# Patient Record
Sex: Female | Born: 1935 | Race: Black or African American | Hispanic: No | Smoking: Never smoker
Health system: Southern US, Community
[De-identification: ages and names within clinical notes are randomized; demographics above are authoritative.]

## PROBLEM LIST (undated history)

## (undated) DIAGNOSIS — I10 Essential (primary) hypertension: Secondary | ICD-10-CM

## (undated) DIAGNOSIS — K56609 Unspecified intestinal obstruction, unspecified as to partial versus complete obstruction: Secondary | ICD-10-CM

## (undated) DIAGNOSIS — I2699 Other pulmonary embolism without acute cor pulmonale: Secondary | ICD-10-CM

## (undated) DIAGNOSIS — K469 Unspecified abdominal hernia without obstruction or gangrene: Secondary | ICD-10-CM

## (undated) DIAGNOSIS — H409 Unspecified glaucoma: Secondary | ICD-10-CM

## (undated) DIAGNOSIS — E119 Type 2 diabetes mellitus without complications: Secondary | ICD-10-CM

## (undated) DIAGNOSIS — K5792 Diverticulitis of intestine, part unspecified, without perforation or abscess without bleeding: Secondary | ICD-10-CM

## (undated) HISTORY — PX: HERNIA REPAIR: SHX51

---

## 2013-02-14 ENCOUNTER — Other Ambulatory Visit: Payer: Self-pay

## 2013-02-14 ENCOUNTER — Emergency Department (HOSPITAL_BASED_OUTPATIENT_CLINIC_OR_DEPARTMENT_OTHER)
Admission: EM | Admit: 2013-02-14 | Discharge: 2013-02-15 | Disposition: A | Discharge status: Home / Self Care | Payer: Medicare Other | Attending: Emergency Medicine | Admitting: Emergency Medicine

## 2013-02-14 ENCOUNTER — Emergency Department (HOSPITAL_BASED_OUTPATIENT_CLINIC_OR_DEPARTMENT_OTHER): Payer: Medicare Other

## 2013-02-14 ENCOUNTER — Encounter (HOSPITAL_BASED_OUTPATIENT_CLINIC_OR_DEPARTMENT_OTHER): Payer: Self-pay

## 2013-02-14 DIAGNOSIS — I10 Essential (primary) hypertension: Secondary | ICD-10-CM | POA: Insufficient documentation

## 2013-02-14 DIAGNOSIS — Z7901 Long term (current) use of anticoagulants: Secondary | ICD-10-CM | POA: Insufficient documentation

## 2013-02-14 DIAGNOSIS — E119 Type 2 diabetes mellitus without complications: Secondary | ICD-10-CM | POA: Insufficient documentation

## 2013-02-14 DIAGNOSIS — Z8719 Personal history of other diseases of the digestive system: Secondary | ICD-10-CM | POA: Insufficient documentation

## 2013-02-14 DIAGNOSIS — Z9889 Other specified postprocedural states: Secondary | ICD-10-CM | POA: Insufficient documentation

## 2013-02-14 DIAGNOSIS — R51 Headache: Secondary | ICD-10-CM | POA: Insufficient documentation

## 2013-02-14 DIAGNOSIS — H409 Unspecified glaucoma: Secondary | ICD-10-CM | POA: Insufficient documentation

## 2013-02-14 DIAGNOSIS — Z79899 Other long term (current) drug therapy: Secondary | ICD-10-CM | POA: Insufficient documentation

## 2013-02-14 DIAGNOSIS — R109 Unspecified abdominal pain: Secondary | ICD-10-CM

## 2013-02-14 DIAGNOSIS — R112 Nausea with vomiting, unspecified: Secondary | ICD-10-CM

## 2013-02-14 DIAGNOSIS — Z86711 Personal history of pulmonary embolism: Secondary | ICD-10-CM | POA: Insufficient documentation

## 2013-02-14 HISTORY — DX: Essential (primary) hypertension: I10

## 2013-02-14 HISTORY — DX: Unspecified glaucoma: H40.9

## 2013-02-14 HISTORY — DX: Unspecified abdominal hernia without obstruction or gangrene: K46.9

## 2013-02-14 HISTORY — DX: Diverticulitis of intestine, part unspecified, without perforation or abscess without bleeding: K57.92

## 2013-02-14 HISTORY — DX: Type 2 diabetes mellitus without complications: E11.9

## 2013-02-14 HISTORY — DX: Other pulmonary embolism without acute cor pulmonale: I26.99

## 2013-02-14 LAB — COMPREHENSIVE METABOLIC PANEL
ALT: 14 U/L (ref 0–35)
AST: 22 U/L (ref 0–37)
Albumin: 3.7 g/dL (ref 3.5–5.2)
Alkaline Phosphatase: 61 U/L (ref 39–117)
BUN: 22 mg/dL (ref 6–23)
CO2: 29 mEq/L (ref 19–32)
Calcium: 11.1 mg/dL — ABNORMAL HIGH (ref 8.4–10.5)
Chloride: 100 mEq/L (ref 96–112)
Creatinine, Ser: 0.9 mg/dL (ref 0.50–1.10)
GFR calc Af Amer: 68 mL/min — ABNORMAL LOW (ref >90)
GFR calc non Af Amer: 59 mL/min — ABNORMAL LOW (ref >90)
Glucose, Bld: 159 mg/dL — ABNORMAL HIGH (ref 70–99)
Potassium: 4.5 mEq/L (ref 3.5–5.1)
Sodium: 139 mEq/L (ref 135–145)
Total Bilirubin: 0.3 mg/dL (ref 0.3–1.2)
Total Protein: 8.4 g/dL — ABNORMAL HIGH (ref 6.0–8.3)

## 2013-02-14 LAB — CBC WITH DIFFERENTIAL/PLATELET
Basophils Absolute: 0 10*3/uL (ref 0.0–0.1)
Basophils Relative: 0 % (ref 0–1)
Eosinophils Absolute: 0.1 10*3/uL (ref 0.0–0.7)
Eosinophils Relative: 1 % (ref 0–5)
HCT: 34.3 % — ABNORMAL LOW (ref 36.0–46.0)
Hemoglobin: 11.3 g/dL — ABNORMAL LOW (ref 12.0–15.0)
Lymphocytes Relative: 9 % — ABNORMAL LOW (ref 12–46)
Lymphs Abs: 0.9 10*3/uL (ref 0.7–4.0)
MCH: 29 pg (ref 26.0–34.0)
MCHC: 32.9 g/dL (ref 30.0–36.0)
MCV: 87.9 fL (ref 78.0–100.0)
Monocytes Absolute: 0.7 10*3/uL (ref 0.1–1.0)
Monocytes Relative: 7 % (ref 3–12)
Neutro Abs: 8.9 10*3/uL — ABNORMAL HIGH (ref 1.7–7.7)
Neutrophils Relative %: 84 % — ABNORMAL HIGH (ref 43–77)
Platelets: 188 10*3/uL (ref 150–400)
RBC: 3.9 MIL/uL (ref 3.87–5.11)
RDW: 15.7 % — ABNORMAL HIGH (ref 11.5–15.5)
WBC: 10.6 10*3/uL — ABNORMAL HIGH (ref 4.0–10.5)

## 2013-02-14 LAB — PROTIME-INR
INR: 1.58 — ABNORMAL HIGH (ref 0.00–1.49)
Prothrombin Time: 18.4 seconds — ABNORMAL HIGH (ref 11.6–15.2)

## 2013-02-14 LAB — URINALYSIS, ROUTINE W REFLEX MICROSCOPIC
Bilirubin Urine: NEGATIVE
Glucose, UA: NEGATIVE mg/dL
Hgb urine dipstick: NEGATIVE
Ketones, ur: NEGATIVE mg/dL
Leukocytes, UA: NEGATIVE
Nitrite: NEGATIVE
Protein, ur: NEGATIVE mg/dL
Specific Gravity, Urine: 1.008 (ref 1.005–1.030)
Urobilinogen, UA: 0.2 mg/dL (ref 0.0–1.0)
pH: 7.5 (ref 5.0–8.0)

## 2013-02-14 LAB — LIPASE, BLOOD: Lipase: 25 U/L (ref 11–59)

## 2013-02-14 MED ORDER — IOHEXOL 300 MG/ML  SOLN
50.0000 mL | Freq: Once | INTRAMUSCULAR | Status: AC | PRN
  Administered 2013-02-14: 50 mL via ORAL

## 2013-02-14 MED ORDER — ONDANSETRON HCL 4 MG/2ML IJ SOLN: 4.0000 mg | Freq: Once | INTRAMUSCULAR | Status: DC

## 2013-02-14 MED ORDER — ONDANSETRON 8 MG PO TBDP: 8.0000 mg | ORAL_TABLET | Freq: Three times a day (TID) | ORAL | Status: DC | PRN

## 2013-02-14 MED ORDER — SODIUM CHLORIDE 0.9 % IV BOLUS (SEPSIS)
500.0000 mL | Freq: Once | INTRAVENOUS | Status: AC
  Administered 2013-02-14: 500 mL via INTRAVENOUS

## 2013-02-14 MED ORDER — SODIUM CHLORIDE 0.9 % IV BOLUS (SEPSIS)
1000.0000 mL | Freq: Once | INTRAVENOUS | Status: DC
  Administered 2013-02-14: 1000 mL via INTRAVENOUS

## 2013-02-14 MED ORDER — IOHEXOL 300 MG/ML  SOLN
100.0000 mL | Freq: Once | INTRAMUSCULAR | Status: AC | PRN
  Administered 2013-02-14: 100 mL via INTRAVENOUS

## 2013-02-14 MED ORDER — ONDANSETRON HCL 4 MG/2ML IJ SOLN
4.0000 mg | Freq: Once | INTRAMUSCULAR | Status: AC
  Administered 2013-02-14: 4 mg via INTRAVENOUS

## 2013-02-14 NOTE — ED Notes (Signed)
MD with pt  

## 2013-02-14 NOTE — ED Notes (Signed)
Returned from CT.

## 2013-02-14 NOTE — ED Notes (Signed)
Pt in CT.

## 2013-02-14 NOTE — ED Notes (Signed)
Pt states that she had onset of abdominal cramping, nausea, vomiting.  No diarrhea.  Pt Last BM 02/13/2013.  Hx of diverticulitis; long time since last flair per pt

## 2013-02-14 NOTE — ED Notes (Signed)
The patient is undressed and in a gown. The bed rails are up and the bed is locked and in the lowest position. The call light is within reach, warm blankets have been given.

## 2013-02-14 NOTE — ED Notes (Signed)
Transported to CT 

## 2013-02-14 NOTE — ED Notes (Signed)
o2 placed on pt at 2L per [place].for decreased pO2

## 2013-02-14 NOTE — ED Provider Notes (Signed)
History  This chart was scribed for Hilario Quarry, MD by Shari Heritage, ED Scribe. The patient was seen in room MH05/MH05. Patient's care was started at 1726.   CSN: [id number]  Arrival date & time 02/14/13  1713   First MD Initiated Contact with Patient 02/14/13 1726      Chief Complaint  Patient presents with  . Abdominal Pain     Patient is a 79 y.o. female presenting with abdominal pain. The history is provided by the patient. No language interpreter was used.  Abdominal Pain Pain radiates to:  Does not radiate Pain severity:  Severe Onset quality:  Sudden Duration:  7 hours Timing:  Constant Progression:  Unchanged Chronicity:  New Associated symptoms: nausea and vomiting      HPI Comments: Kathryn Bradford is a 79 y.o. female who presents to the Emergency Department complaining of sudden, moderate to severe, constant, abdominal pain onset 7 hours ago. Daughter states that while at church, she had a sudden onset temporal headache followed by severe abdominal pain. Patient says that her headache is resolved, but abdominal pain is still present. There is associated nausea and vomiting. She states that she had a regular breakfast this morning. Patient has a history of diverticulitis, but hasn't had a flare up in several months. Other medical history includes PE, diabetes and hypertension. Patient is currently on Coumadin. Patient's daughter states that she was last hospitalized in 2012. Patient has a history of PE and was giving herself Lovenox injections, but she was developed a series of bruises to her abdomen due to improper administration of the medication. She has a surgical history of hernia repair (2001). Patient uses chewing tobacco, but does not smoke.  PCP - Dr. Wynelle Link, [facility]   Past Medical History  Diagnosis Date  . Diverticulitis   . Hernia   . Diabetes mellitus without complication   . Pulmonary emboli   . Hypertension   . Glaucoma      Past Surgical History  Procedure Laterality Date  . Hernia repair      History reviewed. No pertinent family history.  History  Substance Use Topics  . Smoking status: Never Smoker   . Smokeless tobacco: Current User    Types: Snuff  . Alcohol Use: No    OB History   Grav Para Term Preterm Abortions TAB SAB Ect Mult Living                  Review of Systems  Gastrointestinal: Positive for nausea, vomiting and abdominal pain.  All other systems reviewed and are negative.    Allergies  Review of patient's allergies indicates no known allergies.  Home Medications   Current Outpatient Rx  Name  Route  Sig  Dispense  Refill  . brimonidine (ALPHAGAN P) 0.1 % SOLN   Both Eyes   Place 1 drop into both eyes daily.         Marland Kitchen diltiazem (TIAZAC) 360 MG 24 hr capsule   Oral   Take 360 mg by mouth daily.         . dorzolamide-timolol (COSOPT) 22.3-6.8 MG/ML ophthalmic solution   Both Eyes   Place 1 drop into both eyes 2 (two) times daily.         Marland Kitchen lisinopril (PRINIVIL,ZESTRIL) 10 MG tablet   Oral   Take 10 mg by mouth daily.         . pioglitazone (ACTOS) 15 MG tablet   Oral  Take 15 mg by mouth daily.         . simvastatin (ZOCOR) 10 MG tablet   Oral   Take 10 mg by mouth at bedtime.         Marland Kitchen warfarin (COUMADIN) 7.5 MG tablet   Oral   Take 7.5 mg by mouth daily.           Triage Vitals: BP 170/64  Pulse 84  Temp(Src) 97.6 F (36.4 C) (Oral)  Resp 20  SpO2 96%  Physical Exam  Constitutional: She is oriented to person, place, and time. She appears well-developed and well-nourished. No distress.  HENT:  Head: Normocephalic and atraumatic.  Eyes: Conjunctivae and EOM are normal. Pupils are equal, round, and reactive to light.  Neck: Normal range of motion. Neck supple.  Cardiovascular: Normal rate, regular rhythm and normal heart sounds.   Abdominal: Soft. There is tenderness.  Hyperactive bowel sounds. Anterior hernias that are  easily reducible, nontender.  Neurological: She is alert and oriented to person, place, and time.  Skin: Skin is warm and dry.    ED Course  Procedures (including critical care time) DIAGNOSTIC STUDIES: Oxygen Saturation is 96% on room air, adequate by my interpretation.    COORDINATION OF CARE: 5:50 PM- Patient informed of current plan for treatment and evaluation and agrees with plan at this time.    Labs Reviewed  CBC WITH DIFFERENTIAL - Abnormal; Notable for the following:    WBC 10.6 (*)    Hemoglobin 11.3 (*)    HCT 34.3 (*)    RDW 15.7 (*)    Neutrophils Relative 84 (*)    Neutro Abs 8.9 (*)    Lymphocytes Relative 9 (*)    All other components within normal limits  COMPREHENSIVE METABOLIC PANEL - Abnormal; Notable for the following:    Glucose, Bld 159 (*)    Calcium 11.1 (*)    Total Protein 8.4 (*)    GFR calc non Af Amer 59 (*)    GFR calc Af Amer 68 (*)    All other components within normal limits  PROTIME-INR - Abnormal; Notable for the following:    Prothrombin Time 18.4 (*)    INR 1.58 (*)    All other components within normal limits  LIPASE, BLOOD  URINALYSIS, ROUTINE W REFLEX MICROSCOPIC             11.3 (*)    HCT 34.3 (*)    RDW 15.7 (*)    Neutrophils Relative 84 (*)    Neutro Abs 8.9 (*)    Lymphocytes Relative 9 (*)    All other components within normal limits  COMPREHENSIVE METABOLIC PANEL - Abnormal; Notable for the following:    Glucose, Bld 159 (*)    Calcium 11.1 (*)    Total Protein 8.4 (*)    GFR calc non Af Amer 59 (*)    GFR calc Af Amer 68 (*)    All other components within normal limits  PROTIME-INR - Abnormal; Notable for the following:    Prothrombin Time 18.4 (*)    INR 1.58 (*)    All other components within normal limits  LIPASE, BLOOD  URINALYSIS, ROUTINE W REFLEX MICROSCOPIC    No results found.   No diagnosis found.    MDM  Patient feels improved.  Labs reviewed.  Discussed plan with patient and family and  voice agreement.   I personally performed the services described in this documentation, which was scribed in  my presence. The recorded information has been reviewed and considered.   Hilario Quarry, MD 02/16/13 [phone]

## 2013-02-15 MED ORDER — METRONIDAZOLE 500 MG PO TABS: 500.0000 mg | ORAL_TABLET | Freq: Two times a day (BID) | ORAL | Status: DC

## 2013-02-15 MED ORDER — ONDANSETRON 8 MG PO TBDP: 8.0000 mg | ORAL_TABLET | Freq: Three times a day (TID) | ORAL | Status: DC | PRN

## 2013-02-15 MED ORDER — CIPROFLOXACIN HCL 500 MG PO TABS: 500.0000 mg | ORAL_TABLET | Freq: Two times a day (BID) | ORAL | Status: DC

## 2013-02-15 NOTE — ED Provider Notes (Signed)
Ct shows few hernia, reducible on my exam. She also has some enteritis, non specific. Non tender abd exam Pt advised to consume soft diet. We will give her cipro and flagyl. Advised to return to the ER id symptoms get worse.    Derwood Kaplan, MD 02/15/13 [phone]

## 2013-02-15 NOTE — ED Notes (Signed)
MD with pt  

## 2013-08-16 ENCOUNTER — Emergency Department (HOSPITAL_BASED_OUTPATIENT_CLINIC_OR_DEPARTMENT_OTHER): Payer: Medicare Other

## 2013-08-16 ENCOUNTER — Encounter (HOSPITAL_BASED_OUTPATIENT_CLINIC_OR_DEPARTMENT_OTHER): Payer: Self-pay

## 2013-08-16 ENCOUNTER — Emergency Department (HOSPITAL_BASED_OUTPATIENT_CLINIC_OR_DEPARTMENT_OTHER)
Admission: EM | Admit: 2013-08-16 | Discharge: 2013-08-17 | Disposition: A | Discharge status: Other Acute Inpatient Hospital | Payer: Medicare Other | Attending: Emergency Medicine | Admitting: Emergency Medicine

## 2013-08-16 DIAGNOSIS — Z79899 Other long term (current) drug therapy: Secondary | ICD-10-CM | POA: Insufficient documentation

## 2013-08-16 DIAGNOSIS — Z792 Long term (current) use of antibiotics: Secondary | ICD-10-CM | POA: Insufficient documentation

## 2013-08-16 DIAGNOSIS — H409 Unspecified glaucoma: Secondary | ICD-10-CM | POA: Insufficient documentation

## 2013-08-16 DIAGNOSIS — I2699 Other pulmonary embolism without acute cor pulmonale: Secondary | ICD-10-CM | POA: Insufficient documentation

## 2013-08-16 DIAGNOSIS — I10 Essential (primary) hypertension: Secondary | ICD-10-CM | POA: Insufficient documentation

## 2013-08-16 DIAGNOSIS — E119 Type 2 diabetes mellitus without complications: Secondary | ICD-10-CM | POA: Insufficient documentation

## 2013-08-16 DIAGNOSIS — Z7901 Long term (current) use of anticoagulants: Secondary | ICD-10-CM | POA: Insufficient documentation

## 2013-08-16 DIAGNOSIS — K5732 Diverticulitis of large intestine without perforation or abscess without bleeding: Secondary | ICD-10-CM | POA: Insufficient documentation

## 2013-08-16 DIAGNOSIS — K56609 Unspecified intestinal obstruction, unspecified as to partial versus complete obstruction: Secondary | ICD-10-CM | POA: Insufficient documentation

## 2013-08-16 HISTORY — DX: Unspecified intestinal obstruction, unspecified as to partial versus complete obstruction: K56.609

## 2013-08-16 LAB — COMPREHENSIVE METABOLIC PANEL
ALT: 14 U/L (ref 0–35)
AST: 21 U/L (ref 0–37)
Albumin: 4.1 g/dL (ref 3.5–5.2)
Alkaline Phosphatase: 78 U/L (ref 39–117)
BUN: 21 mg/dL (ref 6–23)
CO2: 29 mEq/L (ref 19–32)
Calcium: 11.5 mg/dL — ABNORMAL HIGH (ref 8.4–10.5)
Chloride: 98 mEq/L (ref 96–112)
Creatinine, Ser: 0.8 mg/dL (ref 0.50–1.10)
GFR calc Af Amer: 78 mL/min — ABNORMAL LOW (ref >90)
GFR calc non Af Amer: 67 mL/min — ABNORMAL LOW (ref >90)
Glucose, Bld: 181 mg/dL — ABNORMAL HIGH (ref 70–99)
Potassium: 3.8 mEq/L (ref 3.5–5.1)
Sodium: 138 mEq/L (ref 135–145)
Total Bilirubin: 0.5 mg/dL (ref 0.3–1.2)
Total Protein: 8.9 g/dL — ABNORMAL HIGH (ref 6.0–8.3)

## 2013-08-16 LAB — URINALYSIS, ROUTINE W REFLEX MICROSCOPIC
Bilirubin Urine: NEGATIVE
Glucose, UA: NEGATIVE mg/dL
Hgb urine dipstick: NEGATIVE
Ketones, ur: NEGATIVE mg/dL
Leukocytes, UA: NEGATIVE
Nitrite: NEGATIVE
Protein, ur: 100 mg/dL — AB
Specific Gravity, Urine: 1.013 (ref 1.005–1.030)
Urobilinogen, UA: 0.2 mg/dL (ref 0.0–1.0)
pH: 8.5 — ABNORMAL HIGH (ref 5.0–8.0)

## 2013-08-16 LAB — CBC WITH DIFFERENTIAL/PLATELET
Basophils Absolute: 0 10*3/uL (ref 0.0–0.1)
Basophils Relative: 0 % (ref 0–1)
Eosinophils Absolute: 0.1 10*3/uL (ref 0.0–0.7)
Eosinophils Relative: 1 % (ref 0–5)
HCT: 40.6 % (ref 36.0–46.0)
Hemoglobin: 13.5 g/dL (ref 12.0–15.0)
Lymphocytes Relative: 11 % — ABNORMAL LOW (ref 12–46)
Lymphs Abs: 0.8 10*3/uL (ref 0.7–4.0)
MCH: 30.3 pg (ref 26.0–34.0)
MCHC: 33.3 g/dL (ref 30.0–36.0)
MCV: 91 fL (ref 78.0–100.0)
Monocytes Absolute: 0.5 10*3/uL (ref 0.1–1.0)
Monocytes Relative: 6 % (ref 3–12)
Neutro Abs: 6.3 10*3/uL (ref 1.7–7.7)
Neutrophils Relative %: 82 % — ABNORMAL HIGH (ref 43–77)
Platelets: 170 10*3/uL (ref 150–400)
RBC: 4.46 MIL/uL (ref 3.87–5.11)
RDW: 14 % (ref 11.5–15.5)
WBC: 7.6 10*3/uL (ref 4.0–10.5)

## 2013-08-16 LAB — URINE MICROSCOPIC-ADD ON

## 2013-08-16 LAB — LIPASE, BLOOD: Lipase: 21 U/L (ref 11–59)

## 2013-08-16 MED ORDER — IOHEXOL 300 MG/ML  SOLN
50.0000 mL | Freq: Once | INTRAMUSCULAR | Status: AC | PRN
  Administered 2013-08-16: 50 mL via ORAL

## 2013-08-16 MED ORDER — ONDANSETRON HCL 4 MG/2ML IJ SOLN
4.0000 mg | Freq: Once | INTRAMUSCULAR | Status: AC
  Administered 2013-08-16: 4 mg via INTRAVENOUS
  Filled 2013-08-16: qty 2

## 2013-08-16 MED ORDER — SODIUM CHLORIDE 0.9 % IV SOLN
1000.0000 mL | Freq: Once | INTRAVENOUS | Status: AC
  Administered 2013-08-16: 1000 mL via INTRAVENOUS

## 2013-08-16 MED ORDER — HYDROMORPHONE HCL PF 1 MG/ML IJ SOLN
0.5000 mg | Freq: Once | INTRAMUSCULAR | Status: AC
  Administered 2013-08-16: 0.5 mg via INTRAVENOUS
  Filled 2013-08-16: qty 1

## 2013-08-16 NOTE — ED Notes (Signed)
CT notified pt has completed oral contrast.  

## 2013-08-16 NOTE — ED Notes (Signed)
MD at bedside. 

## 2013-08-16 NOTE — ED Notes (Signed)
abd pain, n/v since 3pm

## 2013-08-16 NOTE — ED Provider Notes (Addendum)
CSN: [id number]     Arrival date & time 08/16/13  1957 History  This chart was scribed for Kathryn Munch, MD by Greggory Stallion, ED Scribe. This patient was seen in room MH03/MH03 and the patient's care was started at 8:46 PM.    Chief Complaint  Patient presents with  . Abdominal Pain   HPI HPI Comments: Kathryn Bradford is a 79 y.o. female with a history of diabetes, glaucoma, HTN, multiple abdominal surgeries, previously diagnosed hernias and at least one bowel obstruction who presents to the Emergency Department complaining of emesis which started today around 4 PM (five hours pta). Pt also c/o of a pain in her lower abdominal region described as "burning" due to the hernias that she has. Pt has not been treated surgically for her hernias due to medical issues and age.  The patient was last admitted for bowel obstruction approximately one year ago.  During that admission she was deemed a non-surgical candidate. During this episode there've been no clear alleviating factors.  She typically can reduce hernias when she feels similar symptoms, but that has not been possible this episode.   Dr. Chales Salmon [facility])   Past Medical History  Diagnosis Date  . Diverticulitis   . Hernia   . Diabetes mellitus without complication   . Pulmonary emboli   . Hypertension   . Glaucoma   . Bowel obstruction    Past Surgical History  Procedure Laterality Date  . Hernia repair     No family history on file. History  Substance Use Topics  . Smoking status: Never Smoker   . Smokeless tobacco: Current User    Types: Snuff  . Alcohol Use: No   OB History   Grav Para Term Preterm Abortions TAB SAB Ect Mult Living                 Review of Systems  Constitutional:       Per HPI, otherwise negative  HENT:       Per HPI, otherwise negative  Respiratory:       Per HPI, otherwise negative  Cardiovascular:       Per HPI, otherwise negative  Gastrointestinal: Positive for nausea and  vomiting. Negative for blood in stool.  Endocrine:       Negative aside from HPI  Genitourinary:       Neg aside from HPI   Musculoskeletal:       Per HPI, otherwise negative  Skin: Negative.   Neurological: Positive for weakness. Negative for syncope.    Allergies  Review of patient's allergies indicates no known allergies.  Home Medications   Current Outpatient Rx  Name  Route  Sig  Dispense  Refill  . brimonidine (ALPHAGAN P) 0.1 % SOLN   Both Eyes   Place 1 drop into both eyes daily.         . ciprofloxacin (CIPRO) 500 MG tablet   Oral   Take 1 tablet (500 mg total) by mouth every 12 (twelve) hours.   10 tablet   0   . diltiazem (TIAZAC) 360 MG 24 hr capsule   Oral   Take 360 mg by mouth daily.         . dorzolamide-timolol (COSOPT) 22.3-6.8 MG/ML ophthalmic solution   Both Eyes   Place 1 drop into both eyes 2 (two) times daily.         Marland Kitchen lisinopril (PRINIVIL,ZESTRIL) 10 MG tablet   Oral   Take 10 mg by  mouth daily.         . metroNIDAZOLE (FLAGYL) 500 MG tablet   Oral   Take 1 tablet (500 mg total) by mouth 2 (two) times daily.   14 tablet   0   . ondansetron (ZOFRAN ODT) 8 MG disintegrating tablet   Oral   Take 1 tablet (8 mg total) by mouth every 8 (eight) hours as needed for nausea.   20 tablet   0   . ondansetron (ZOFRAN ODT) 8 MG disintegrating tablet   Oral   Take 1 tablet (8 mg total) by mouth every 8 (eight) hours as needed for nausea.   20 tablet   0   . pioglitazone (ACTOS) 15 MG tablet   Oral   Take 15 mg by mouth daily.         . simvastatin (ZOCOR) 10 MG tablet   Oral   Take 10 mg by mouth at bedtime.         Marland Kitchen warfarin (COUMADIN) 7.5 MG tablet   Oral   Take 7.5 mg by mouth daily.          BP 167/87  Pulse 73  Temp(Src) 98.3 F (36.8 C) (Oral)  Resp 20  Ht 5\' 4"  (1.626 m)  Wt 174 lb (78.926 kg)  BMI 29.85 kg/m2  SpO2 98% Physical Exam  Constitutional: She is oriented to person, place, and time. She  appears ill.  HENT:  Head: Normocephalic and atraumatic.  Eyes: Conjunctivae are normal. Pupils are equal, round, and reactive to light. Right eye exhibits no discharge. Left eye exhibits no discharge.  Neck: No tracheal deviation present.  Cardiovascular: Normal rate and intact distal pulses.   Murmur heard. Pulmonary/Chest: Effort normal. No stridor. No respiratory distress.  Abdominal: There is tenderness. There is guarding.  There are multiple abnormalities in the lower abdomen including midline surgical scar, palpable masses on either side of midline.  There is no reduce-ability of the hernias.  Bowel sounds are appreciable. The upper abdomen is essentially soft.  Neurological: She is alert and oriented to person, place, and time. No cranial nerve deficit. She exhibits normal muscle tone. Coordination normal.  Skin: Skin is warm and dry.  Psychiatric: She is slowed and withdrawn.    ED Course  Procedures (including critical care time)  DIAGNOSTIC STUDIES: Oxygen Saturation is 98% on RA, Normal by my interpretation.    COORDINATION OF CARE: 8:30 PM-Discussed treatment plan which includes pain medication, nausua medication, blood work, and CT scan. Pt agreed to plan.     Labs Review Labs Reviewed  CBC WITH DIFFERENTIAL - Abnormal; Notable for the following:    Neutrophils Relative % 82 (*)    Lymphocytes Relative 11 (*)    All other components within normal limits  COMPREHENSIVE METABOLIC PANEL - Abnormal; Notable for the following:    Glucose, Bld 181 (*)    Calcium 11.5 (*)    Total Protein 8.9 (*)    GFR calc non Af Amer 67 (*)    GFR calc Af Amer 78 (*)    All other components within normal limits  URINALYSIS, ROUTINE W REFLEX MICROSCOPIC - Abnormal; Notable for the following:    APPearance TURBID (*)    pH 8.5 (*)    Protein, ur 100 (*)    All other components within normal limits  URINE MICROSCOPIC-ADD ON - Abnormal; Notable for the following:    Squamous  Epithelial / LPF FEW (*)    All other components  within normal limits  LIPASE, BLOOD   Imaging Review No results found.   11:56 PM On repeat assessment the patient appears in a similar condition.  I discussed results with her with patient and her family members.  With recurrent bowel obstruction she'll be admitted for further evaluation and management.  The patient requests [facility].   12:03 AM I discussed her case with the surgical team at [facility]), they will follow as a consulting service. MDM  SBO   This patient with hernia, prior bowel obstruction now presents with abdominal pain, nausea, vomiting, bloating sensation.  On exam there is an appreciable hernia that is nonreducible.  Though there are bowel sounds, the immediate concern is for bowel structure, which is demonstrated on CT scan.  Patient has previously been deemed a nonsurgical candidate.  However, with her pain, she will be admitted for further evaluation and management.  Attempts to pass NG tube was performed prior to transfer.   I personally performed the services described in this documentation, which was scribed in my presence. The recorded information has been reviewed and is accurate.      Kathryn Munch, MD 08/17/13 0000  Kathryn Munch, MD 08/17/13 1027  Kathryn Munch, MD 08/17/13 [phone]

## 2013-08-17 LAB — GLUCOSE, CAPILLARY: Glucose-Capillary: 135 mg/dL — ABNORMAL HIGH (ref 70–99)

## 2013-08-17 LAB — CG4 I-STAT (LACTIC ACID): Lactic Acid, Venous: 1.37 mmol/L (ref 0.5–2.2)

## 2013-08-17 LAB — PROTIME-INR
INR: 2.09 — ABNORMAL HIGH (ref 0.00–1.49)
Prothrombin Time: 22.8 seconds — ABNORMAL HIGH (ref 11.6–15.2)

## 2013-08-17 NOTE — ED Notes (Signed)
Pt alert and oriented. Denies pain and nausea at present. Refused NG tube at this time. States she has not had one before with admission for SBO in the past

## 2013-08-17 NOTE — ED Notes (Addendum)
To be transferred to [facility] by [facility]. Admission process explained to pt and family

## 2013-08-31 ENCOUNTER — Non-Acute Institutional Stay (SKILLED_NURSING_FACILITY): Payer: Medicare Other | Admitting: Internal Medicine

## 2013-08-31 DIAGNOSIS — I2699 Other pulmonary embolism without acute cor pulmonale: Secondary | ICD-10-CM

## 2013-08-31 DIAGNOSIS — D62 Acute posthemorrhagic anemia: Secondary | ICD-10-CM

## 2013-08-31 DIAGNOSIS — I80299 Phlebitis and thrombophlebitis of other deep vessels of unspecified lower extremity: Secondary | ICD-10-CM

## 2013-08-31 DIAGNOSIS — K431 Incisional hernia with gangrene: Secondary | ICD-10-CM

## 2013-08-31 DIAGNOSIS — E119 Type 2 diabetes mellitus without complications: Secondary | ICD-10-CM

## 2013-10-05 DIAGNOSIS — I2699 Other pulmonary embolism without acute cor pulmonale: Secondary | ICD-10-CM | POA: Insufficient documentation

## 2013-10-05 DIAGNOSIS — E119 Type 2 diabetes mellitus without complications: Secondary | ICD-10-CM | POA: Insufficient documentation

## 2013-10-05 DIAGNOSIS — K431 Incisional hernia with gangrene: Secondary | ICD-10-CM | POA: Insufficient documentation

## 2013-10-05 DIAGNOSIS — I80299 Phlebitis and thrombophlebitis of other deep vessels of unspecified lower extremity: Secondary | ICD-10-CM | POA: Insufficient documentation

## 2013-10-05 DIAGNOSIS — D62 Acute posthemorrhagic anemia: Secondary | ICD-10-CM | POA: Insufficient documentation

## 2013-10-05 NOTE — Progress Notes (Signed)
Patient ID: Kathryn Bradford, female   DOB: Jan 23, 1930, 79 y.o.   MRN: [id number]        HISTORY & PHYSICAL  DATE: 08/31/2013   FACILITY: [facility]  LEVEL OF CARE: SNF (31)  ALLERGIES:   Salicylates.     Penicillin and derivatives.    CHIEF COMPLAINT:  Manage incisional abdominal hernia, diabetes mellitus, and acute blood loss anemia.    HISTORY OF PRESENT ILLNESS:  The patient is an 79 year-old, African-American female.    INCISIONAL ABDOMINAL HERNIA:  The patient was hospitalized with obstructive incisional hernia.  She was having abdominal pain and diarrhea.  CT of the abdomen and pelvis showed a complex anterior abdominal wall hernia with herniation of small and large bowel with proximal small bowel obstruction.  She subsequently underwent exploratory laparotomy with lysis of adhesions and repair of the hernia.  She tolerated the procedure well.  After hospitalization, she is admitted to this facility for short-term rehabilitation.  She denies abdominal pain.    ANEMIA: Postoperatively, patient suffered acute blood loss.   The anemia has been stable. The patient denies fatigue, melena or hematochezia. The patient is currently not on iron.    Last hemoglobin level:    Hemoglobin level 11.1, MCV 92.    DM:pt's DM remains stable.  Pt denies polyuria, polydipsia, polyphagia, changes in vision or hypoglycemic episodes.  No complications noted from the medication presently being used.  Last hemoglobin A1c is:  Not available.    PAST MEDICAL HISTORY :  Past Medical History  Diagnosis Date  . Diverticulitis   . Hernia   . Diabetes mellitus without complication   . Pulmonary emboli   . Hypertension   . Glaucoma   . Bowel obstruction    Neuropathy.    GERD.    Osteoporosis.    Hyperlipidemia.    History of DVT.    PAST SURGICAL HISTORY: Past Surgical History  Procedure Laterality Date  . Hernia repair    Small bowel obstruction repair.    Hysterectomy.    Breast reduction surgery.    SOCIAL HISTORY:  reports that she has never smoked. Her smokeless tobacco use includes Snuff. She reports that she does not drink alcohol or use illicit drugs.  FAMILY HISTORY:   Hypertension.    CURRENT MEDICATIONS: Reviewed per [facility]  REVIEW OF SYSTEMS:  See HPI otherwise 14 point ROS is negative.  PHYSICAL EXAMINATION  VS:  T 97.7       P 64      RR 18      BP 149/71      POX%        WT (Lb)  GENERAL: no acute distress, moderately obese body habitus EYES: conjunctivae normal, sclerae normal, normal eye lids MOUTH/THROAT: lips without lesions,no lesions in the mouth,tongue is without lesions,uvula elevates in midline NECK: supple, trachea midline, no neck masses, no thyroid tenderness, no thyromegaly LYMPHATICS: no LAN in the neck, no supraclavicular LAN RESPIRATORY: breathing is even & unlabored, BS CTAB CARDIAC: RRR, no murmur,no extra heart sounds EDEMA/VARICOSITIES:  +1 bilateral lower extremity edema  ARTERIAL:  pedal pulses +1 bilaterally    GI:  ABDOMEN: unable to assess, binder present   MUSCULOSKELETAL: HEAD: normal to inspection & palpation BACK: no kyphosis, scoliosis or spinal processes tenderness EXTREMITIES: LEFT UPPER EXTREMITY: full range of motion, normal strength & tone RIGHT UPPER EXTREMITY:  full range of motion, normal strength & tone LEFT LOWER EXTREMITY:  full range of  motion, normal strength & tone RIGHT LOWER EXTREMITY:  full range of motion, normal strength & tone PSYCHIATRIC: the patient is alert & oriented to person, affect & behavior appropriate  LABS/RADIOLOGY: INR today is 2.2.    At hospital discharge:  Hemoglobin 11.1, MCV 92, otherwise CBC normal.    Glucose 144, albumin 2.3, otherwise CMP normal.    2D-echo:  Showed EF of 60-65%.    ASSESSMENT/PLAN:  Small bowel obstruction and incisional hernia.  Status post laparotomy and repair.  Continue wound care.    Acute blood loss anemia.   Reassess hemoglobin level.    Diabetes mellitus.  Continue current medications.    History of DVT and PE.  Continue anticoagulation.    V58.61.  Continue current Coumadin dose.  Recheck PT/INR on 09/03/2013.    Hypertension.  Blood pressure borderline.  We will monitor.    Neuropathy.  Continue Neurontin.    Check CBC and BMP.    I have reviewed patient's medical records received at admission/from hospitalization.  CPT CODE: 40981

## 2016-07-24 ENCOUNTER — Ambulatory Visit (INDEPENDENT_AMBULATORY_CARE_PROVIDER_SITE_OTHER): Payer: Medicare Other | Admitting: Podiatry

## 2016-07-24 ENCOUNTER — Encounter: Payer: Self-pay | Admitting: Podiatry

## 2016-07-24 VITALS — BP 170/73 | HR 62 | Ht 62.0 in | Wt 161.0 lb

## 2016-07-24 DIAGNOSIS — M79606 Pain in leg, unspecified: Secondary | ICD-10-CM | POA: Diagnosis not present

## 2016-07-24 DIAGNOSIS — B351 Tinea unguium: Secondary | ICD-10-CM | POA: Diagnosis not present

## 2016-07-24 NOTE — Progress Notes (Signed)
SUBJECTIVE: 82 y.o. year old female presents requesting toe nails trimmed. They are thick and hurt to walk. Patient is ambulatory walk with walker. She has Glaucoma and will have her eyes checked soon.  Blood sugar is under control.  REVIEW OF SYSTEMS: Medical record reviewed and noted of pertinent information.   OBJECTIVE: DERMATOLOGIC EXAMINATION: Thick dystrophic nails x 10. Plantar calluses under 5th MPJ bilateral.   VASCULAR EXAMINATION OF LOWER LIMBS: Pedal pulses are not palpable on both DP and PT bilateral.  Capillary Filling times within 3 seconds in all digits.  No edema or erythema noted. Temperature gradient from tibial crest to dorsum of foot is within normal bilateral.  NEUROLOGIC EXAMINATION OF THE LOWER LIMBS: All epicritic and tactile sensations grossly intact.  Sharp and Dull discriminatory sensations at the plantar ball of hallux is intact bilateral.   MUSCULOSKELETAL EXAMINATION: No gross deformities noted.  ASSESSMENT: Onychomycosis. Diabetic Type II under control. Pain in lower limb due to thick nails.   PLAN: Clinical findings reviewed and available treatment options explained. All nails debrided.

## 2016-07-24 NOTE — Patient Instructions (Signed)
Seen for hypertrophic nails. All nails debrided. Return in 3 months or as needed.  

## 2018-03-12 ENCOUNTER — Emergency Department (HOSPITAL_BASED_OUTPATIENT_CLINIC_OR_DEPARTMENT_OTHER)
Admission: EM | Admit: 2018-03-12 | Discharge: 2018-03-12 | Disposition: A | Discharge status: Home / Self Care | Payer: Medicare Other | Attending: Emergency Medicine | Admitting: Emergency Medicine

## 2018-03-12 ENCOUNTER — Emergency Department (HOSPITAL_BASED_OUTPATIENT_CLINIC_OR_DEPARTMENT_OTHER): Payer: Medicare Other

## 2018-03-12 ENCOUNTER — Other Ambulatory Visit: Payer: Self-pay

## 2018-03-12 ENCOUNTER — Encounter (HOSPITAL_BASED_OUTPATIENT_CLINIC_OR_DEPARTMENT_OTHER): Payer: Self-pay | Admitting: *Deleted

## 2018-03-12 DIAGNOSIS — W07XXXA Fall from chair, initial encounter: Secondary | ICD-10-CM | POA: Diagnosis not present

## 2018-03-12 DIAGNOSIS — Y999 Unspecified external cause status: Secondary | ICD-10-CM | POA: Diagnosis not present

## 2018-03-12 DIAGNOSIS — M25512 Pain in left shoulder: Secondary | ICD-10-CM | POA: Diagnosis not present

## 2018-03-12 DIAGNOSIS — S098XXA Other specified injuries of head, initial encounter: Secondary | ICD-10-CM | POA: Insufficient documentation

## 2018-03-12 DIAGNOSIS — Z79899 Other long term (current) drug therapy: Secondary | ICD-10-CM | POA: Insufficient documentation

## 2018-03-12 DIAGNOSIS — Z7901 Long term (current) use of anticoagulants: Secondary | ICD-10-CM | POA: Diagnosis not present

## 2018-03-12 DIAGNOSIS — S161XXA Strain of muscle, fascia and tendon at neck level, initial encounter: Secondary | ICD-10-CM | POA: Diagnosis not present

## 2018-03-12 DIAGNOSIS — Y9389 Activity, other specified: Secondary | ICD-10-CM | POA: Insufficient documentation

## 2018-03-12 DIAGNOSIS — Y929 Unspecified place or not applicable: Secondary | ICD-10-CM | POA: Diagnosis not present

## 2018-03-12 DIAGNOSIS — S0990XA Unspecified injury of head, initial encounter: Secondary | ICD-10-CM

## 2018-03-12 DIAGNOSIS — E119 Type 2 diabetes mellitus without complications: Secondary | ICD-10-CM | POA: Diagnosis not present

## 2018-03-12 DIAGNOSIS — I10 Essential (primary) hypertension: Secondary | ICD-10-CM | POA: Insufficient documentation

## 2018-03-12 DIAGNOSIS — W19XXXA Unspecified fall, initial encounter: Secondary | ICD-10-CM

## 2018-03-12 MED ORDER — LIDOCAINE 5 % EX PTCH: 1.0000 | MEDICATED_PATCH | CUTANEOUS | 0 refills | Status: DC

## 2018-03-12 MED ORDER — TRAMADOL HCL 50 MG PO TABS
50.0000 mg | ORAL_TABLET | Freq: Once | ORAL | Status: AC
  Administered 2018-03-12: 50 mg via ORAL
  Filled 2018-03-12: qty 1

## 2018-03-12 NOTE — ED Notes (Signed)
Pt states she is just having lower back pain now 2/10, family is responding over the pt on assessment. Pt is AO x 4 responding to all question and able to move all extremities. No open skin or laceration noticed on assessment.

## 2018-03-12 NOTE — ED Notes (Signed)
Patient transported to CT 

## 2018-03-12 NOTE — ED Provider Notes (Signed)
[facility] EMERGENCY DEPARTMENT Provider Note   CSN: [id number] Arrival date & time: 03/12/18  2113     History   Chief Complaint Chief Complaint  Patient presents with  . Fall    HPI Kathryn Bradford is a 84 y.o. female.  Patient is an 84 year old female with a history of diabetes, hypertension, PE and DVT on Coumadin presenting today after a fall.  Patient was eating dinner when the back legs of the chair broke causing her to fall backwards and hit her neck, shoulder and head on a concrete floor.  This occurred approximately 2 hours ago.  Patient had no loss of consciousness but states she has had ongoing pain to the left side of her neck and shoulder since this occurred.  Family states she has been at her normal mental status and she was able to stand after the event.  She denies any lower back pain, chest pain, rib pain or shortness of breath.  Patient's last INR was checked on Tuesday and was 2.8.  She denies any visual changes, unilateral numbness or weakness.  The history is provided by the patient and a relative.  Fall  This is a new problem. The current episode started 1 to 2 hours ago. The problem occurs constantly. The problem has not changed since onset.Associated symptoms include headaches. Associated symptoms comments: Left-sided neck pain and left shoulder pain. The symptoms are aggravated by bending and twisting. Nothing relieves the symptoms. She has tried nothing for the symptoms. The treatment provided no relief.    Past Medical History:  Diagnosis Date  . Bowel obstruction (HCC)   . Diabetes mellitus without complication (HCC)   . Diverticulitis   . Glaucoma   . Hernia   . Hypertension   . Pulmonary emboli [facility])     Patient Active Problem List   Diagnosis Date Noted  . Incisional hernia with gangrene and obstruction 10/05/2013  . Acute posthemorrhagic anemia 10/05/2013  . Type II or unspecified type diabetes mellitus without mention of  complication, not stated as uncontrolled 10/05/2013  . Phlebitis and thrombophlebitis of other deep vessels of lower extremities 10/05/2013  . Other pulmonary embolism and infarction 10/05/2013    Past Surgical History:  Procedure Laterality Date  . HERNIA REPAIR       OB History   None      Home Medications    Prior to Admission medications   Medication Sig Start Date End Date Taking? Authorizing Provider  brimonidine (ALPHAGAN P) 0.1 % SOLN Place 1 drop into both eyes daily.   Yes [provider]  Calcium Carbonate-Vitamin D (CALCIUM-VITAMIN D) 500-200 MG-UNIT tablet Take by mouth.   Yes [provider]  diclofenac (VOLTAREN) 0.1 % ophthalmic solution 1 drop Four (4) times a day.   Yes [provider]  diltiazem (TIAZAC) 360 MG 24 hr capsule Take 360 mg by mouth daily.   Yes [provider]  gabapentin (NEURONTIN) 300 MG capsule  06/19/16  Yes [provider]  magnesium oxide (MAG-OX) 400 MG tablet Take 400 mg by mouth.   Yes [provider]  simvastatin (ZOCOR) 10 MG tablet Take 10 mg by mouth at bedtime.   Yes [provider]  TRAMADOL HCL ER PO Take by mouth.   Yes [provider]  warfarin (COUMADIN) 7.5 MG tablet Take 7.5 mg by mouth daily.   Yes [provider]  carbamide peroxide (EAR DROPS) 6.5 % otic solution 1 drop Two (2) times a  day.    [provider]  ciprofloxacin (CIPRO) 500 MG tablet  06/19/16   [provider]  Cyanocobalamin (RA VITAMIN B-12 TR) 1000 MCG TBCR Take by mouth.    [provider]  dorzolamide-timolol (COSOPT) 22.3-6.8 MG/ML ophthalmic solution Place 1 drop into both eyes 2 (two) times daily.    [provider]  lisinopril (PRINIVIL,ZESTRIL) 10 MG tablet Take 10 mg by mouth daily.    [provider]  metroNIDAZOLE (FLAGYL) 500 MG tablet Take 1 tablet (500 mg total) by mouth 2 (two) times daily. 02/15/13   Derwood Kaplan, MD    Omega-3 1000 MG CAPS Take 1 g by mouth.    [provider]  ondansetron (ZOFRAN ODT) 8 MG disintegrating tablet Take 1 tablet (8 mg total) by mouth every 8 (eight) hours as needed for nausea. Patient not taking: Reported on 07/24/2016 02/14/13   Margarita Grizzle, MD  ondansetron (ZOFRAN ODT) 8 MG disintegrating tablet Take 1 tablet (8 mg total) by mouth every 8 (eight) hours as needed for nausea. Patient not taking: Reported on 07/24/2016 02/15/13   Derwood Kaplan, MD  pioglitazone (ACTOS) 15 MG tablet Take 15 mg by mouth daily.    [provider]  saxagliptin HCl (ONGLYZA) 5 MG TABS tablet  06/19/16   [provider]  solifenacin (VESICARE) 10 MG tablet  06/19/16   [provider]    Family History No family history on file.  Social History Social History   Tobacco Use  . Smoking status: Never Smoker  . Smokeless tobacco: Never Used  Substance Use Topics  . Alcohol use: No  . Drug use: No     Allergies   Penicillin g and Penicillins   Review of Systems Review of Systems  Neurological: Positive for headaches.  All other systems reviewed and are negative.    Physical Exam Updated Vital Signs BP (!) 127/56   Pulse (!) 56   Temp 98.8 F (37.1 C) (Oral)   Resp 16   Ht 5\' 4"  (1.626 m)   Wt 72.1 kg (159 lb)   SpO2 94%   BMI 27.29 kg/m   Physical Exam  Constitutional: She is oriented to person, place, and time. She appears well-developed and well-nourished. No distress.  HENT:  Head: Normocephalic and atraumatic.  Mouth/Throat: Oropharynx is clear and moist.  Eyes: Pupils are equal, round, and reactive to light. Conjunctivae and EOM are normal.  Neck: Normal range of motion. Neck supple. Spinous process tenderness and muscular tenderness present. Normal range of motion present.    Cardiovascular: Normal rate, regular rhythm and intact distal pulses.  No murmur heard. Pulmonary/Chest: Effort normal and breath sounds normal. No  respiratory distress. She has no wheezes. She has no rales.  Abdominal: Soft. She exhibits no distension. There is no tenderness. There is no rebound and no guarding.  Musculoskeletal: Normal range of motion. She exhibits tenderness. She exhibits no edema.       Left shoulder: She exhibits tenderness, bony tenderness and pain. She exhibits normal range of motion, no deformity, normal pulse and normal strength.  Lower extremities with skin changes consistent with chronic venous stasis  Neurological: She is alert and oriented to person, place, and time.  Skin: Skin is warm and dry. No rash noted. No erythema.  Psychiatric: She has a normal mood and affect. Her behavior is normal.  Nursing note and vitals reviewed.    ED Treatments / Results  Labs (all labs ordered are listed, but  only abnormal results are displayed) Labs Reviewed - No data to display  EKG None  Radiology Ct Head Wo Contrast  Result Date: 03/12/2018 CLINICAL DATA:  Fall from chair EXAM: CT HEAD WITHOUT CONTRAST CT CERVICAL SPINE WITHOUT CONTRAST TECHNIQUE: Multidetector CT imaging of the head and cervical spine was performed following the standard protocol without intravenous contrast. Multiplanar CT image reconstructions of the cervical spine were also generated. COMPARISON:  Cervical spine and head CT 11/29/2008 FINDINGS: CT HEAD FINDINGS Brain: No mass lesion, intraparenchymal hemorrhage or extra-axial collection. No evidence of acute cortical infarct. Brain parenchyma and CSF-containing spaces are normal for age. Vascular: Atherosclerotic calcification of the vertebral arteries at the skull base. Skull: Normal visualized skull base, calvarium and extracranial soft tissues. Sinuses/Orbits: No sinus fluid levels or advanced mucosal thickening. No mastoid effusion. Normal orbits. CT CERVICAL SPINE FINDINGS Alignment: Grade 1 C4-C5 anterolisthesis is unchanged. Alignment is otherwise normal. Skull base and vertebrae: No acute  fracture. Soft tissues and spinal canal: No prevertebral fluid or swelling. No visible canal hematoma. Disc levels: Progression of degenerative disc disease since the prior study, with disc space narrowing and endplate sclerosis at C5-6 and C6-7. The right C4-C5 facets are fused. The left C3-C5 facets are fused. No spinal canal stenosis. Upper chest: No pneumothorax, pulmonary nodule or pleural effusion. Other: Normal visualized paraspinal cervical soft tissues. IMPRESSION: 1. No acute intracranial abnormality. 2. No acute listhesis or fracture of the cervical spine. 3. Multilevel moderate degenerative disc disease with unchanged grade 1 C4-C5 anterolisthesis. Electronically Signed   By: Deatra Robinson M.D.   On: 03/12/2018 22:36   Ct Cervical Spine Wo Contrast  Result Date: 03/12/2018 CLINICAL DATA:  Fall from chair EXAM: CT HEAD WITHOUT CONTRAST CT CERVICAL SPINE WITHOUT CONTRAST TECHNIQUE: Multidetector CT imaging of the head and cervical spine was performed following the standard protocol without intravenous contrast. Multiplanar CT image reconstructions of the cervical spine were also generated. COMPARISON:  Cervical spine and head CT 11/29/2008 FINDINGS: CT HEAD FINDINGS Brain: No mass lesion, intraparenchymal hemorrhage or extra-axial collection. No evidence of acute cortical infarct. Brain parenchyma and CSF-containing spaces are normal for age. Vascular: Atherosclerotic calcification of the vertebral arteries at the skull base. Skull: Normal visualized skull base, calvarium and extracranial soft tissues. Sinuses/Orbits: No sinus fluid levels or advanced mucosal thickening. No mastoid effusion. Normal orbits. CT CERVICAL SPINE FINDINGS Alignment: Grade 1 C4-C5 anterolisthesis is unchanged. Alignment is otherwise normal. Skull base and vertebrae: No acute fracture. Soft tissues and spinal canal: No prevertebral fluid or swelling. No visible canal hematoma. Disc levels: Progression of degenerative disc  disease since the prior study, with disc space narrowing and endplate sclerosis at C5-6 and C6-7. The right C4-C5 facets are fused. The left C3-C5 facets are fused. No spinal canal stenosis. Upper chest: No pneumothorax, pulmonary nodule or pleural effusion. Other: Normal visualized paraspinal cervical soft tissues. IMPRESSION: 1. No acute intracranial abnormality. 2. No acute listhesis or fracture of the cervical spine. 3. Multilevel moderate degenerative disc disease with unchanged grade 1 C4-C5 anterolisthesis. Electronically Signed   By: Deatra Robinson M.D.   On: 03/12/2018 22:36   Dg Shoulder Left  Result Date: 03/12/2018 CLINICAL DATA:  Fall with pain EXAM: LEFT SHOULDER - 2+ VIEW COMPARISON:  None. FINDINGS: Moderate AC joint degenerative change. No acute displaced fracture or dislocation. Left lung apex is clear. IMPRESSION: No acute osseous abnormality Electronically Signed   By: Jasmine Pang M.D.   On: 03/12/2018 22:44    Procedures  Procedures (including critical care time)  Medications Ordered in ED Medications  traMADol (ULTRAM) tablet 50 mg (50 mg Oral Given 03/12/18 2209)     Initial Impression / Assessment and Plan / ED Course  I have reviewed the triage vital signs and the nursing notes.  Pertinent labs & imaging results that were available during my care of the patient were reviewed by me and considered in my medical decision making (see chart for details).     Elderly female with a mechanical fall today when her chair broke causing her to fall backwards and hit her head, neck and shoulder on a concrete floor.  This occurred approximately 2 hours ago and she continues to have neck, left shoulder pain and some mild headache.  Patient does take anticoagulation and last INR this week was 2.8.  Patient is neurologically intact at this time.  She is having mostly left-sided neck pain without significant central tenderness and scapular pain.  Feel most likely this is all  musculoskeletal and low suspicion for cervical fracture.  Head CT, C-spine CT and left shoulder image is pending.  Patient takes tramadol at home for pain and has not had any since this morning and was given a dose.  11:14 PM Imaging neg for acute process.  Pt d/ced home with lidoderm patches and continue to take home meds.  Final Clinical Impressions(s) / ED Diagnoses   Final diagnoses:  None    ED Discharge Orders    None       Gwyneth SproutPlunkett, Illya Gienger, MD 03/12/18 2314

## 2018-03-12 NOTE — ED Triage Notes (Signed)
She was eating and her chair broke. Her landed on the left side of her body hitting a concrete floor. Pain in the left side of her head, neck, shoulder and hip. No LOC.

## 2018-07-28 IMAGING — CT CT CERVICAL SPINE W/O CM
3 of 7 series · 13 of 33 positions shown, 15 images · non-contrast
Comparison: Cervical spine and head CT 11/29/2008

CLINICAL DATA: Fall from chair

EXAM:
CT HEAD WITHOUT CONTRAST
CT CERVICAL SPINE WITHOUT CONTRAST
TECHNIQUE: Multidetector CT imaging of the head and cervical spine was
performed following the standard protocol without intravenous
contrast. Multiplanar CT image reconstructions of the cervical spine
were also generated.

[Series 4: head 3.0 mpr cor · coronal · 0.29mm/px · 3 of 67 slices shown]
[im 17/67  bone]
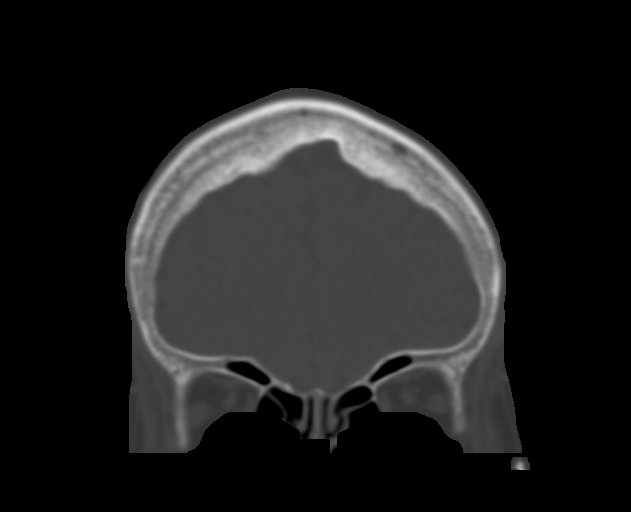
[im 34/67  bone]
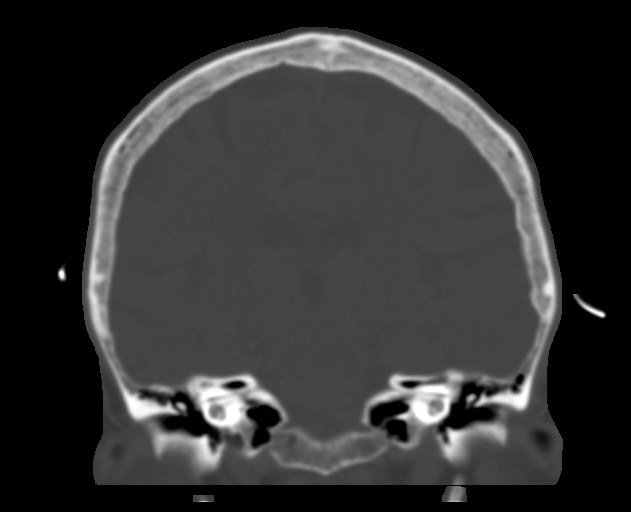
[im 50/67  bone]
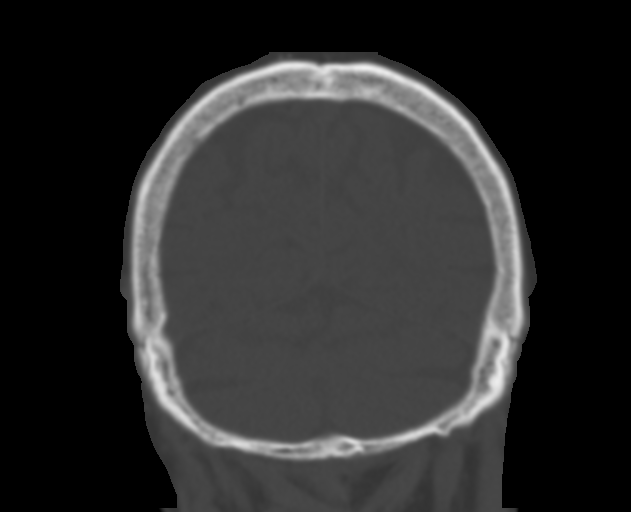

[Series 10: sagittals · sagittal · 0.27mm/px · 5 of 73 slices shown]
[im 13/73  bone]
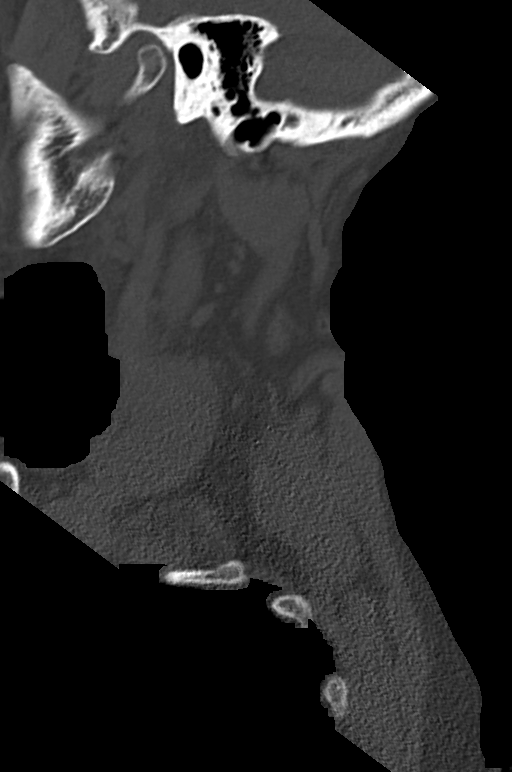
[im 25/73  bone]
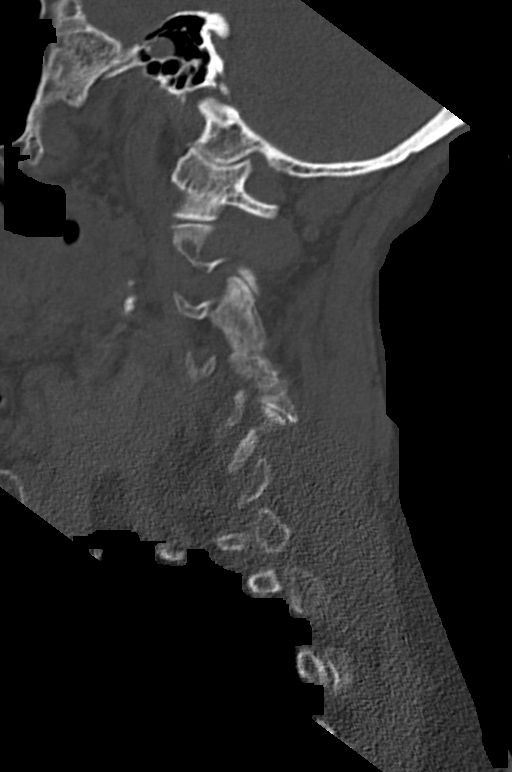
[im 37/73  bone]
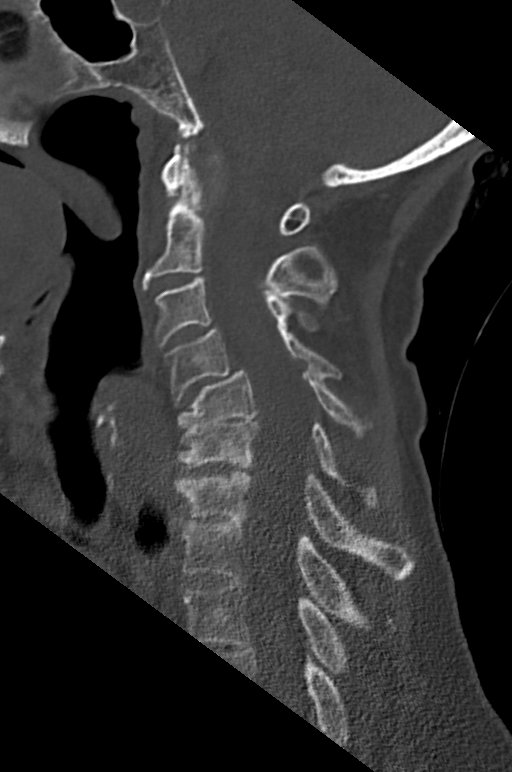
[im 49/73  bone]
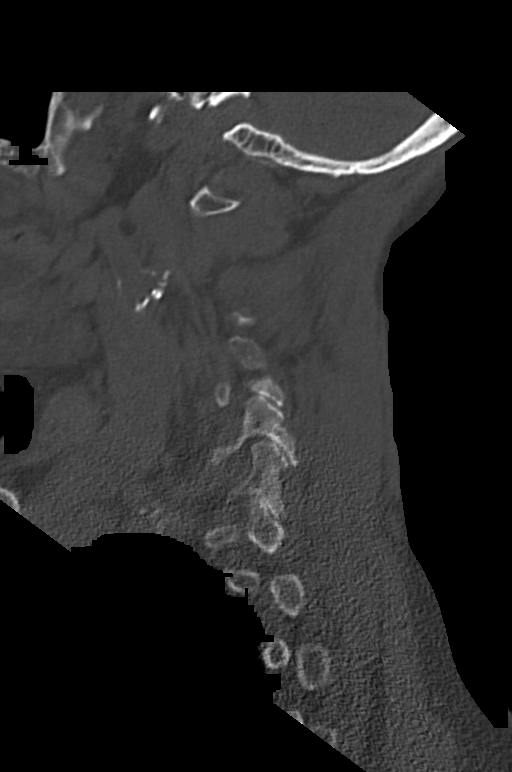
[im 61/73  bone]
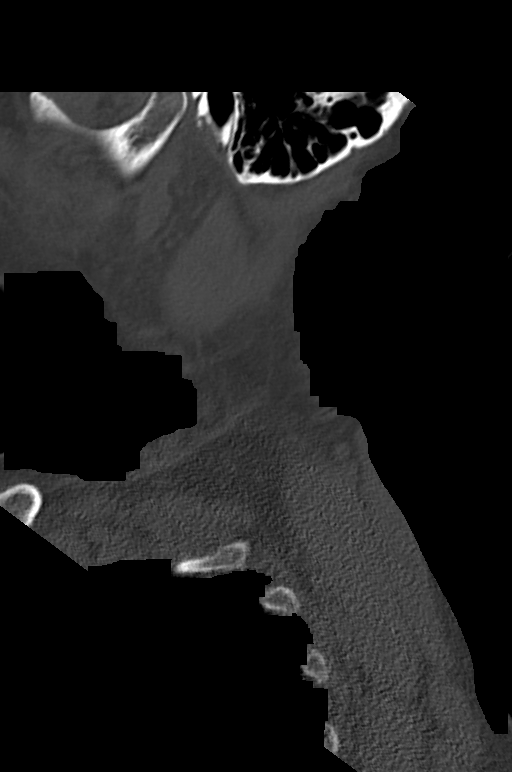

[Series 11: orthogonals · axial · 0.27mm/px · z∈[+710,+819]mm · 5 of 103 slices shown, 7 images]
[im 18/103  soft-tissue]
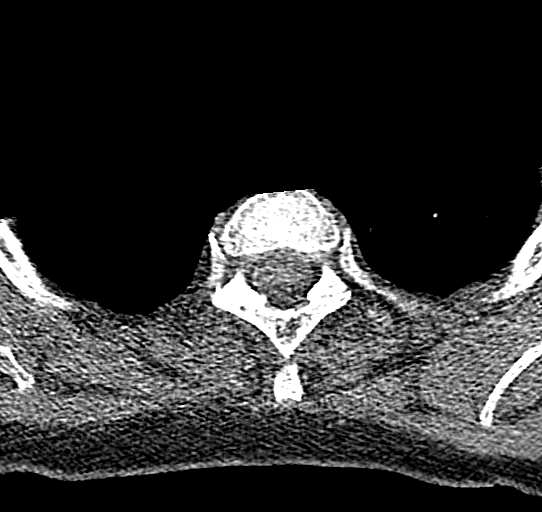
[im 18/103  bone]
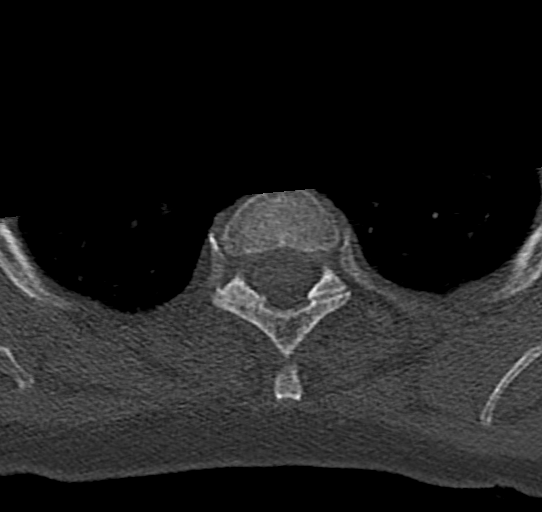
[im 35/103  bone]
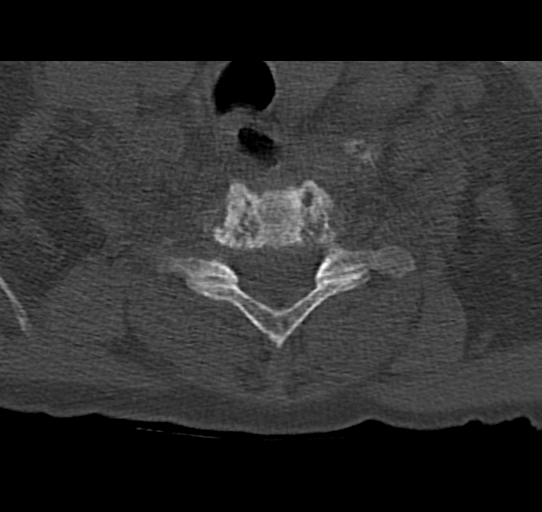
[im 52/103  bone]
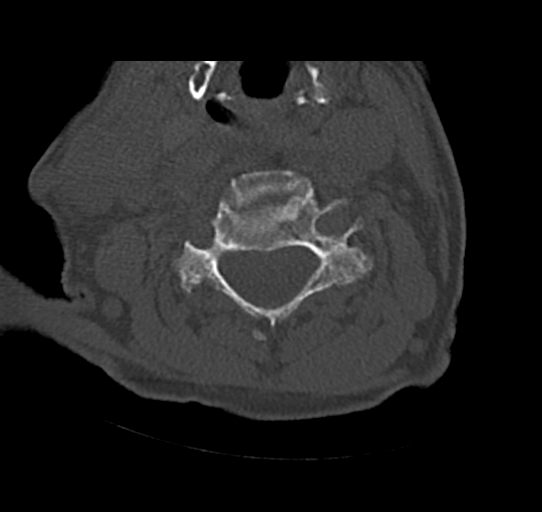
[im 69/103  bone]
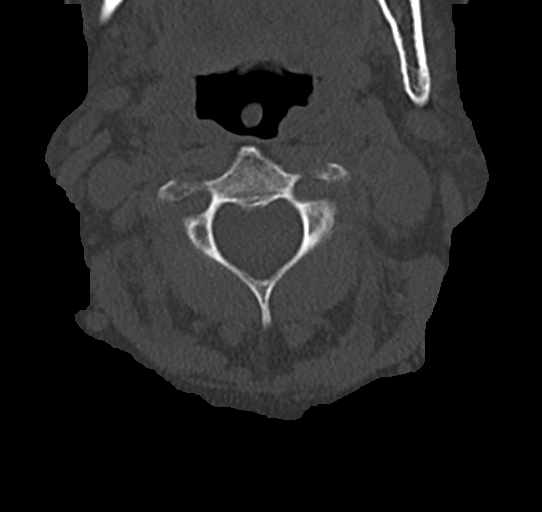
[im 86/103  soft-tissue]
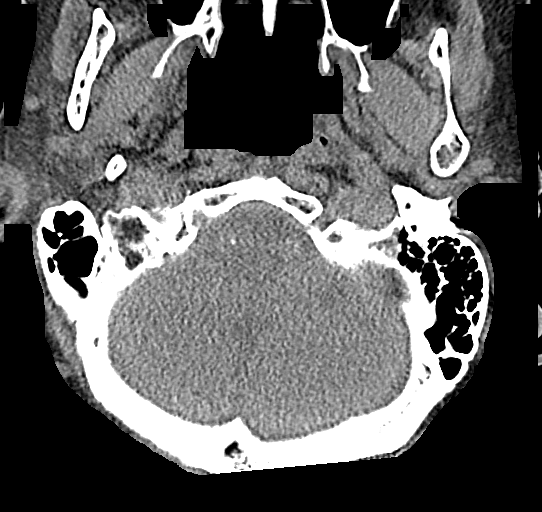
[im 86/103  bone]
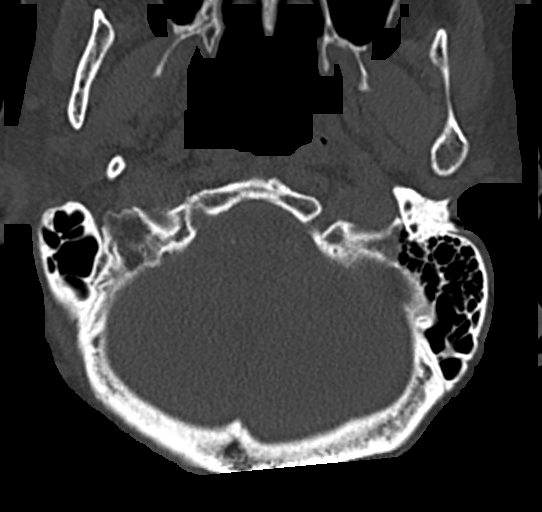

[13 of 33 positions shown; findings below may reference images not displayed]

FINDINGS: CT HEAD FINDINGS

Brain: No mass lesion, intraparenchymal hemorrhage or extra-axial
collection. No evidence of acute cortical infarct. Brain parenchyma
and CSF-containing spaces are normal for age.

Vascular: Atherosclerotic calcification of the vertebral arteries at
the skull base.

Skull: Normal visualized skull base, calvarium and extracranial soft
tissues.

Sinuses/Orbits: No sinus fluid levels or advanced mucosal
thickening. No mastoid effusion. Normal orbits.

CT CERVICAL SPINE FINDINGS

Alignment: Grade 1 C4-C5 anterolisthesis is unchanged. Alignment is
otherwise normal.

Skull base and vertebrae: No acute fracture.

Soft tissues and spinal canal: No prevertebral fluid or swelling. No
visible canal hematoma.

Disc levels: Progression of degenerative disc disease since the
prior study, with disc space narrowing and endplate sclerosis at
C5-6 and C6-7. The right C4-C5 facets are fused. The left C3-C5
facets are fused. No spinal canal stenosis.

Upper chest: No pneumothorax, pulmonary nodule or pleural effusion.

Other: Normal visualized paraspinal cervical soft tissues.
IMPRESSION: 1. No acute intracranial abnormality.
2. No acute listhesis or fracture of the cervical spine.
3. Multilevel moderate degenerative disc disease with unchanged
grade 1 C4-C5 anterolisthesis.

## 2018-11-19 ENCOUNTER — Emergency Department (HOSPITAL_BASED_OUTPATIENT_CLINIC_OR_DEPARTMENT_OTHER)
Admission: EM | Admit: 2018-11-19 | Discharge: 2018-11-19 | Disposition: A | Discharge status: Home / Self Care | Payer: Medicare Other | Attending: Emergency Medicine | Admitting: Emergency Medicine

## 2018-11-19 ENCOUNTER — Other Ambulatory Visit: Payer: Self-pay

## 2018-11-19 ENCOUNTER — Encounter (HOSPITAL_BASED_OUTPATIENT_CLINIC_OR_DEPARTMENT_OTHER): Payer: Self-pay | Admitting: *Deleted

## 2018-11-19 DIAGNOSIS — Z79899 Other long term (current) drug therapy: Secondary | ICD-10-CM | POA: Insufficient documentation

## 2018-11-19 DIAGNOSIS — R233 Spontaneous ecchymoses: Secondary | ICD-10-CM | POA: Diagnosis not present

## 2018-11-19 DIAGNOSIS — F0390 Unspecified dementia without behavioral disturbance: Secondary | ICD-10-CM | POA: Insufficient documentation

## 2018-11-19 DIAGNOSIS — E119 Type 2 diabetes mellitus without complications: Secondary | ICD-10-CM | POA: Insufficient documentation

## 2018-11-19 DIAGNOSIS — Z7984 Long term (current) use of oral hypoglycemic drugs: Secondary | ICD-10-CM | POA: Diagnosis not present

## 2018-11-19 DIAGNOSIS — Z7901 Long term (current) use of anticoagulants: Secondary | ICD-10-CM | POA: Diagnosis not present

## 2018-11-19 DIAGNOSIS — R58 Hemorrhage, not elsewhere classified: Secondary | ICD-10-CM

## 2018-11-19 DIAGNOSIS — I10 Essential (primary) hypertension: Secondary | ICD-10-CM | POA: Diagnosis not present

## 2018-11-19 LAB — CBC
HCT: 31.6 % — ABNORMAL LOW (ref 36.0–46.0)
Hemoglobin: 9.9 g/dL — ABNORMAL LOW (ref 12.0–15.0)
MCH: 30.1 pg (ref 26.0–34.0)
MCHC: 31.3 g/dL (ref 30.0–36.0)
MCV: 96 fL (ref 80.0–100.0)
Platelets: 146 10*3/uL — ABNORMAL LOW (ref 150–400)
RBC: 3.29 MIL/uL — ABNORMAL LOW (ref 3.87–5.11)
RDW: 14.7 % (ref 11.5–15.5)
WBC: 4.6 10*3/uL (ref 4.0–10.5)
nRBC: 0 % (ref 0.0–0.2)

## 2018-11-19 LAB — PROTIME-INR
INR: 1.7
Prothrombin Time: 19.8 seconds — ABNORMAL HIGH (ref 11.4–15.2)

## 2018-11-19 NOTE — Discharge Instructions (Signed)
°  Nice to see you today! Please follow up with your PCP to monitor INR. Please return to be seen if you have any new or worsening concerns.

## 2018-11-19 NOTE — ED Triage Notes (Signed)
She has a bruise on her left inner thigh with soreness. She noticed it yesterday. Does not remember an injury.

## 2018-11-19 NOTE — ED Provider Notes (Signed)
[facility] EMERGENCY DEPARTMENT Provider Note   CSN: [id number] Arrival date & time: 11/19/18  1949   History   Chief Complaint Chief Complaint  Patient presents with  . Leg Pain    HPI Kathryn Bradford is a 84 y.o. female with PMH of T2DM, HTN, and DVT/PE on warfarin who was brought in by her daughter due to concern over a bruise on her inner left thigh. Her daughter states the patient is on coumadin and her last INR on Monday was subtherapeutic. She is worried her mother has a blood clot in her thigh. Her mother lives alone and has been diagnosed with dementia. The daughter is seeking guardianship. She is unsure if her mother fell or hurt herself. She reports her mother has been fatigued lately.   HPI  Past Medical History:  Diagnosis Date  . Bowel obstruction (HCC)   . Diabetes mellitus without complication (HCC)   . Diverticulitis   . Glaucoma   . Hernia   . Hypertension   . Pulmonary emboli [facility])     Patient Active Problem List   Diagnosis Date Noted  . Incisional hernia with gangrene and obstruction 10/05/2013  . Acute posthemorrhagic anemia 10/05/2013  . Type II or unspecified type diabetes mellitus without mention of complication, not stated as uncontrolled 10/05/2013  . Phlebitis and thrombophlebitis of other deep vessels of lower extremities 10/05/2013  . Other pulmonary embolism and infarction 10/05/2013    Past Surgical History:  Procedure Laterality Date  . HERNIA REPAIR       OB History   No obstetric history on file.      Home Medications    Prior to Admission medications   Medication Sig Start Date End Date Taking? Authorizing Provider  brimonidine (ALPHAGAN P) 0.1 % SOLN Place 1 drop into both eyes daily.    [provider]  Calcium Carbonate-Vitamin D (CALCIUM-VITAMIN D) 500-200 MG-UNIT tablet Take by mouth.    [provider]  carbamide peroxide (EAR DROPS) 6.5 % otic solution 1 drop Two (2) times a day.     [provider]  ciprofloxacin (CIPRO) 500 MG tablet  06/19/16   [provider]  Cyanocobalamin (RA VITAMIN B-12 TR) 1000 MCG TBCR Take by mouth.    [provider]  diclofenac (VOLTAREN) 0.1 % ophthalmic solution 1 drop Four (4) times a day.    [provider]  diltiazem (TIAZAC) 360 MG 24 hr capsule Take 360 mg by mouth daily.    [provider]  dorzolamide-timolol (COSOPT) 22.3-6.8 MG/ML ophthalmic solution Place 1 drop into both eyes 2 (two) times daily.    [provider]  gabapentin (NEURONTIN) 300 MG capsule  06/19/16   [provider]  lidocaine (LIDODERM) 5 % Place 1 patch onto the skin daily. Remove & Discard patch within 12 hours or as directed by MD 03/12/18   Gwyneth Sprout, MD  lisinopril (PRINIVIL,ZESTRIL) 10 MG tablet Take 10 mg by mouth daily.    [provider]  magnesium oxide (MAG-OX) 400 MG tablet Take 400 mg by mouth.    [provider]  metroNIDAZOLE (FLAGYL) 500 MG tablet Take 1 tablet (500 mg total) by mouth 2 (two) times daily. 02/15/13   Derwood Kaplan, MD  Omega-3 1000 MG CAPS Take 1 g by mouth.    [provider]  ondansetron (ZOFRAN ODT) 8 MG disintegrating tablet Take 1 tablet (8 mg total) by mouth every 8 (eight) hours as needed for nausea. Patient not  taking: Reported on 07/24/2016 02/14/13   Margarita Grizzleay, Danielle, MD  ondansetron (ZOFRAN ODT) 8 MG disintegrating tablet Take 1 tablet (8 mg total) by mouth every 8 (eight) hours as needed for nausea. Patient not taking: Reported on 07/24/2016 02/15/13   Derwood KaplanNanavati, Ankit, MD  pioglitazone (ACTOS) 15 MG tablet Take 15 mg by mouth daily.    [provider]  saxagliptin HCl (ONGLYZA) 5 MG TABS tablet  06/19/16   [provider]  simvastatin (ZOCOR) 10 MG tablet Take 10 mg by mouth at bedtime.    [provider]  solifenacin (VESICARE) 10 MG tablet  06/19/16   [provider]  TRAMADOL HCL ER PO Take by mouth.     [provider]  warfarin (COUMADIN) 7.5 MG tablet Take 7.5 mg by mouth daily.    [provider]    Family History No family history on file.  Social History Social History   Tobacco Use  . Smoking status: Never Smoker  . Smokeless tobacco: Never Used  Substance Use Topics  . Alcohol use: No  . Drug use: No     Allergies   Penicillin g and Penicillins   Review of Systems Review of Systems  Unable to perform ROS: Dementia     Physical Exam Updated Vital Signs BP (!) 114/59   Pulse (!) 55   Temp 97.6 F (36.4 C) (Oral)   Resp 18   Ht 5\' 3"  (1.6 m)   Wt 72.6 kg   SpO2 100%   BMI 28.34 kg/m   Physical Exam Vitals signs and nursing note reviewed.  Constitutional:      General: She is not in acute distress.    Appearance: Normal appearance.  HENT:     Head: Normocephalic and atraumatic.     Mouth/Throat:     Mouth: Mucous membranes are moist.  Cardiovascular:     Rate and Rhythm: Normal rate.  Pulmonary:     Effort: Pulmonary effort is normal. No respiratory distress.  Abdominal:     General: There is no distension.     Palpations: Abdomen is soft.  Musculoskeletal:        General: No swelling.  Skin:    General: Skin is warm and dry.     Findings: Ecchymosis (left medial thigh) present.      ED Treatments / Results  Labs (all labs ordered are listed, but only abnormal results are displayed) Labs Reviewed - No data to display  EKG None  Radiology No results found.  Procedures Procedures (including critical care time)  Medications Ordered in ED Medications - No data to display   Initial Impression / Assessment and Plan / ED Course  I have reviewed the triage vital signs and the nursing notes.  Pertinent labs & imaging results that were available during my care of the patient were reviewed by me and considered in my medical decision making (see chart for details).     84 year old on warfarin for hx of DVT/PE seen  due to concern over ecchymosis on inner left thigh. There is no hematoma on exam, the ecchymosis is superficial, minimally tender. No concern for clot. INR 1.7 and being followed by PCP. Hgb 9.9, no recent values available for comparison. Recommended patient follow up with PCP to monitor blood counts. Reassurance provided. Stable for discharge home. Patient's family verbalized understanding and agreement with plan.   Final Clinical Impressions(s) / ED Diagnoses   Final diagnoses:  None    ED  Discharge Orders    None       Tillman Sers, DO 11/19/18 2251    Little, Ambrose Finland, MD 11/21/18 1115

## 2018-11-19 NOTE — ED Notes (Signed)
Small bruise purple in color noted left inner thigh. Skin intact. Denies fall or injury. Family at bedside.

## 2020-03-13 ENCOUNTER — Encounter (HOSPITAL_COMMUNITY): Payer: Self-pay | Admitting: Internal Medicine

## 2020-03-13 ENCOUNTER — Inpatient Hospital Stay (HOSPITAL_COMMUNITY): Payer: Medicare Other

## 2020-03-13 ENCOUNTER — Emergency Department (HOSPITAL_COMMUNITY): Payer: Medicare Other

## 2020-03-13 ENCOUNTER — Inpatient Hospital Stay (HOSPITAL_COMMUNITY)
Admission: EM | Admit: 2020-03-13 | Discharge: 2020-03-17 | DRG: 689 | Disposition: A | Discharge status: Skilled Nursing Facility | Payer: Medicare Other | Attending: Internal Medicine | Admitting: Internal Medicine

## 2020-03-13 ENCOUNTER — Other Ambulatory Visit: Payer: Self-pay

## 2020-03-13 DIAGNOSIS — E1151 Type 2 diabetes mellitus with diabetic peripheral angiopathy without gangrene: Secondary | ICD-10-CM | POA: Diagnosis present

## 2020-03-13 DIAGNOSIS — J9601 Acute respiratory failure with hypoxia: Secondary | ICD-10-CM | POA: Diagnosis present

## 2020-03-13 DIAGNOSIS — E785 Hyperlipidemia, unspecified: Secondary | ICD-10-CM | POA: Diagnosis present

## 2020-03-13 DIAGNOSIS — I2782 Chronic pulmonary embolism: Secondary | ICD-10-CM | POA: Diagnosis present

## 2020-03-13 DIAGNOSIS — Z20822 Contact with and (suspected) exposure to covid-19: Secondary | ICD-10-CM | POA: Diagnosis present

## 2020-03-13 DIAGNOSIS — E860 Dehydration: Secondary | ICD-10-CM | POA: Diagnosis present

## 2020-03-13 DIAGNOSIS — I10 Essential (primary) hypertension: Secondary | ICD-10-CM | POA: Diagnosis present

## 2020-03-13 DIAGNOSIS — I130 Hypertensive heart and chronic kidney disease with heart failure and stage 1 through stage 4 chronic kidney disease, or unspecified chronic kidney disease: Secondary | ICD-10-CM | POA: Diagnosis present

## 2020-03-13 DIAGNOSIS — I351 Nonrheumatic aortic (valve) insufficiency: Secondary | ICD-10-CM | POA: Diagnosis present

## 2020-03-13 DIAGNOSIS — I739 Peripheral vascular disease, unspecified: Secondary | ICD-10-CM | POA: Diagnosis present

## 2020-03-13 DIAGNOSIS — K5909 Other constipation: Secondary | ICD-10-CM | POA: Diagnosis present

## 2020-03-13 DIAGNOSIS — R4182 Altered mental status, unspecified: Secondary | ICD-10-CM | POA: Diagnosis not present

## 2020-03-13 DIAGNOSIS — R778 Other specified abnormalities of plasma proteins: Secondary | ICD-10-CM | POA: Diagnosis present

## 2020-03-13 DIAGNOSIS — I2699 Other pulmonary embolism without acute cor pulmonale: Secondary | ICD-10-CM | POA: Diagnosis not present

## 2020-03-13 DIAGNOSIS — E1165 Type 2 diabetes mellitus with hyperglycemia: Secondary | ICD-10-CM | POA: Diagnosis present

## 2020-03-13 DIAGNOSIS — M25569 Pain in unspecified knee: Secondary | ICD-10-CM | POA: Diagnosis present

## 2020-03-13 DIAGNOSIS — I358 Other nonrheumatic aortic valve disorders: Secondary | ICD-10-CM | POA: Diagnosis present

## 2020-03-13 DIAGNOSIS — R040 Epistaxis: Secondary | ICD-10-CM | POA: Diagnosis not present

## 2020-03-13 DIAGNOSIS — K56609 Unspecified intestinal obstruction, unspecified as to partial versus complete obstruction: Secondary | ICD-10-CM | POA: Diagnosis present

## 2020-03-13 DIAGNOSIS — I248 Other forms of acute ischemic heart disease: Secondary | ICD-10-CM | POA: Diagnosis present

## 2020-03-13 DIAGNOSIS — H409 Unspecified glaucoma: Secondary | ICD-10-CM | POA: Diagnosis present

## 2020-03-13 DIAGNOSIS — I5032 Chronic diastolic (congestive) heart failure: Secondary | ICD-10-CM | POA: Diagnosis present

## 2020-03-13 DIAGNOSIS — N1832 Chronic kidney disease, stage 3b: Secondary | ICD-10-CM | POA: Diagnosis present

## 2020-03-13 DIAGNOSIS — N183 Chronic kidney disease, stage 3 unspecified: Secondary | ICD-10-CM | POA: Diagnosis present

## 2020-03-13 DIAGNOSIS — E119 Type 2 diabetes mellitus without complications: Secondary | ICD-10-CM

## 2020-03-13 DIAGNOSIS — Z86718 Personal history of other venous thrombosis and embolism: Secondary | ICD-10-CM

## 2020-03-13 DIAGNOSIS — N390 Urinary tract infection, site not specified: Secondary | ICD-10-CM | POA: Diagnosis present

## 2020-03-13 DIAGNOSIS — R809 Proteinuria, unspecified: Secondary | ICD-10-CM | POA: Diagnosis present

## 2020-03-13 DIAGNOSIS — R823 Hemoglobinuria: Secondary | ICD-10-CM | POA: Diagnosis present

## 2020-03-13 DIAGNOSIS — R55 Syncope and collapse: Secondary | ICD-10-CM | POA: Diagnosis not present

## 2020-03-13 DIAGNOSIS — R17 Unspecified jaundice: Secondary | ICD-10-CM | POA: Diagnosis present

## 2020-03-13 DIAGNOSIS — I959 Hypotension, unspecified: Secondary | ICD-10-CM | POA: Diagnosis present

## 2020-03-13 DIAGNOSIS — Z8709 Personal history of other diseases of the respiratory system: Secondary | ICD-10-CM

## 2020-03-13 DIAGNOSIS — E1122 Type 2 diabetes mellitus with diabetic chronic kidney disease: Secondary | ICD-10-CM | POA: Diagnosis present

## 2020-03-13 DIAGNOSIS — N179 Acute kidney failure, unspecified: Secondary | ICD-10-CM | POA: Diagnosis present

## 2020-03-13 DIAGNOSIS — K5790 Diverticulosis of intestine, part unspecified, without perforation or abscess without bleeding: Secondary | ICD-10-CM | POA: Diagnosis present

## 2020-03-13 DIAGNOSIS — Z8719 Personal history of other diseases of the digestive system: Secondary | ICD-10-CM | POA: Diagnosis not present

## 2020-03-13 DIAGNOSIS — I82401 Acute embolism and thrombosis of unspecified deep veins of right lower extremity: Secondary | ICD-10-CM | POA: Diagnosis not present

## 2020-03-13 DIAGNOSIS — E118 Type 2 diabetes mellitus with unspecified complications: Secondary | ICD-10-CM | POA: Diagnosis not present

## 2020-03-13 DIAGNOSIS — Z79899 Other long term (current) drug therapy: Secondary | ICD-10-CM

## 2020-03-13 DIAGNOSIS — Z8616 Personal history of COVID-19: Secondary | ICD-10-CM

## 2020-03-13 DIAGNOSIS — Z880 Allergy status to penicillin: Secondary | ICD-10-CM

## 2020-03-13 DIAGNOSIS — IMO0002 Reserved for concepts with insufficient information to code with codable children: Secondary | ICD-10-CM | POA: Diagnosis present

## 2020-03-13 DIAGNOSIS — Z86711 Personal history of pulmonary embolism: Secondary | ICD-10-CM | POA: Diagnosis present

## 2020-03-13 DIAGNOSIS — Z7901 Long term (current) use of anticoagulants: Secondary | ICD-10-CM

## 2020-03-13 DIAGNOSIS — I82409 Acute embolism and thrombosis of unspecified deep veins of unspecified lower extremity: Secondary | ICD-10-CM | POA: Diagnosis present

## 2020-03-13 DIAGNOSIS — Z8249 Family history of ischemic heart disease and other diseases of the circulatory system: Secondary | ICD-10-CM

## 2020-03-13 LAB — TROPONIN I (HIGH SENSITIVITY)
Troponin I (High Sensitivity): 59 ng/L — ABNORMAL HIGH (ref <18)
Troponin I (High Sensitivity): 84 ng/L — ABNORMAL HIGH (ref <18)
Troponin I (High Sensitivity): 115 ng/L (ref <18)

## 2020-03-13 LAB — URINALYSIS, ROUTINE W REFLEX MICROSCOPIC
Bilirubin Urine: NEGATIVE
Glucose, UA: NEGATIVE mg/dL
Ketones, ur: NEGATIVE mg/dL
Nitrite: NEGATIVE
Protein, ur: 100 mg/dL — AB
RBC / HPF: >50 RBC/hpf — ABNORMAL HIGH (ref 0–5)
Specific Gravity, Urine: 1.02 (ref 1.005–1.030)
WBC, UA: >50 WBC/hpf — ABNORMAL HIGH (ref 0–5)
pH: 5 (ref 5.0–8.0)

## 2020-03-13 LAB — CBC WITH DIFFERENTIAL/PLATELET
Abs Immature Granulocytes: 0.03 10*3/uL (ref 0.00–0.07)
Basophils Absolute: 0 10*3/uL (ref 0.0–0.1)
Basophils Relative: 0 %
Eosinophils Absolute: 0.1 10*3/uL (ref 0.0–0.5)
Eosinophils Relative: 1 %
HCT: 39.5 % (ref 36.0–46.0)
Hemoglobin: 12.6 g/dL (ref 12.0–15.0)
Immature Granulocytes: 0 %
Lymphocytes Relative: 6 %
Lymphs Abs: 0.5 10*3/uL — ABNORMAL LOW (ref 0.7–4.0)
MCH: 31.2 pg (ref 26.0–34.0)
MCHC: 31.9 g/dL (ref 30.0–36.0)
MCV: 97.8 fL (ref 80.0–100.0)
Monocytes Absolute: 0.5 10*3/uL (ref 0.1–1.0)
Monocytes Relative: 6 %
Neutro Abs: 7 10*3/uL (ref 1.7–7.7)
Neutrophils Relative %: 87 %
Platelets: 140 10*3/uL — ABNORMAL LOW (ref 150–400)
RBC: 4.04 MIL/uL (ref 3.87–5.11)
RDW: 16 % — ABNORMAL HIGH (ref 11.5–15.5)
WBC: 8.1 10*3/uL (ref 4.0–10.5)
nRBC: 0 % (ref 0.0–0.2)

## 2020-03-13 LAB — APTT: aPTT: <20 seconds — ABNORMAL LOW (ref 24–36)

## 2020-03-13 LAB — GLUCOSE, CAPILLARY: Glucose-Capillary: 163 mg/dL — ABNORMAL HIGH (ref 70–99)

## 2020-03-13 LAB — COMPREHENSIVE METABOLIC PANEL
ALT: 17 U/L (ref 0–44)
AST: 21 U/L (ref 15–41)
Albumin: 3.2 g/dL — ABNORMAL LOW (ref 3.5–5.0)
Alkaline Phosphatase: 56 U/L (ref 38–126)
Anion gap: 11 (ref 5–15)
BUN: 94 mg/dL — ABNORMAL HIGH (ref 8–23)
CO2: 29 mmol/L (ref 22–32)
Calcium: 9.9 mg/dL (ref 8.9–10.3)
Chloride: 96 mmol/L — ABNORMAL LOW (ref 98–111)
Creatinine, Ser: 3.41 mg/dL — ABNORMAL HIGH (ref 0.44–1.00)
GFR calc Af Amer: 13 mL/min — ABNORMAL LOW (ref >60)
GFR calc non Af Amer: 11 mL/min — ABNORMAL LOW (ref >60)
Glucose, Bld: 178 mg/dL — ABNORMAL HIGH (ref 70–99)
Potassium: 5.1 mmol/L (ref 3.5–5.1)
Sodium: 136 mmol/L (ref 135–145)
Total Bilirubin: 1.4 mg/dL — ABNORMAL HIGH (ref 0.3–1.2)
Total Protein: 8.3 g/dL — ABNORMAL HIGH (ref 6.5–8.1)

## 2020-03-13 LAB — D-DIMER, QUANTITATIVE: D-Dimer, Quant: >20 ug/mL-FEU — ABNORMAL HIGH (ref 0.00–0.50)

## 2020-03-13 LAB — PROTIME-INR
INR: 1 (ref 0.8–1.2)
Prothrombin Time: 12.9 seconds (ref 11.4–15.2)

## 2020-03-13 LAB — HEPARIN LEVEL (UNFRACTIONATED): Heparin Unfractionated: <0.1 IU/mL — ABNORMAL LOW (ref 0.30–0.70)

## 2020-03-13 LAB — LACTIC ACID, PLASMA: Lactic Acid, Venous: 1.7 mmol/L (ref 0.5–1.9)

## 2020-03-13 MED ORDER — SODIUM CHLORIDE 0.9 % IV SOLN
1.0000 g | Freq: Once | INTRAVENOUS | Status: AC
  Administered 2020-03-13: 1 g via INTRAVENOUS
  Filled 2020-03-13: qty 10

## 2020-03-13 MED ORDER — BRIMONIDINE TARTRATE 0.15 % OP SOLN
1.0000 [drp] | Freq: Two times a day (BID) | OPHTHALMIC | Status: DC
  Administered 2020-03-13 – 2020-03-17 (×8): 1 [drp] via OPHTHALMIC
  Filled 2020-03-13 (×2): qty 5

## 2020-03-13 MED ORDER — ACETAMINOPHEN 325 MG PO TABS: 650.0000 mg | ORAL_TABLET | Freq: Four times a day (QID) | ORAL | Status: DC | PRN

## 2020-03-13 MED ORDER — INSULIN ASPART 100 UNIT/ML ~~LOC~~ SOLN
0.0000 [IU] | Freq: Three times a day (TID) | SUBCUTANEOUS | Status: DC
  Administered 2020-03-14: 1 [IU] via SUBCUTANEOUS
  Administered 2020-03-14 – 2020-03-15 (×2): 2 [IU] via SUBCUTANEOUS
  Administered 2020-03-16: 1 [IU] via SUBCUTANEOUS
  Administered 2020-03-17: 11:00:00 2 [IU] via SUBCUTANEOUS

## 2020-03-13 MED ORDER — DRONABINOL 2.5 MG PO CAPS
2.5000 mg | ORAL_CAPSULE | Freq: Two times a day (BID) | ORAL | Status: DC
  Administered 2020-03-13 – 2020-03-17 (×7): 2.5 mg via ORAL
  Filled 2020-03-13 (×7): qty 1

## 2020-03-13 MED ORDER — ACETAMINOPHEN 650 MG RE SUPP: 650.0000 mg | Freq: Four times a day (QID) | RECTAL | Status: DC | PRN

## 2020-03-13 MED ORDER — HEPARIN BOLUS VIA INFUSION
1500.0000 [IU] | Freq: Once | INTRAVENOUS | Status: AC
  Administered 2020-03-13: 1500 [IU] via INTRAVENOUS
  Filled 2020-03-13: qty 1500

## 2020-03-13 MED ORDER — SENNOSIDES-DOCUSATE SODIUM 8.6-50 MG PO TABS: 2.0000 | ORAL_TABLET | Freq: Two times a day (BID) | ORAL | Status: DC | PRN

## 2020-03-13 MED ORDER — MAGNESIUM OXIDE 400 (241.3 MG) MG PO TABS
400.0000 mg | ORAL_TABLET | Freq: Every day | ORAL | Status: DC
  Administered 2020-03-13 – 2020-03-14 (×2): 400 mg via ORAL
  Filled 2020-03-13 (×3): qty 1

## 2020-03-13 MED ORDER — ONDANSETRON HCL 4 MG/2ML IJ SOLN: 4.0000 mg | Freq: Four times a day (QID) | INTRAMUSCULAR | Status: DC | PRN

## 2020-03-13 MED ORDER — ZINC SULFATE 220 (50 ZN) MG PO CAPS
220.0000 mg | ORAL_CAPSULE | Freq: Every day | ORAL | Status: DC
  Administered 2020-03-14 – 2020-03-17 (×4): 220 mg via ORAL
  Filled 2020-03-13 (×4): qty 1

## 2020-03-13 MED ORDER — SODIUM CHLORIDE 0.9 % IV SOLN
1.0000 g | INTRAVENOUS | Status: DC
  Administered 2020-03-14 – 2020-03-17 (×4): 1 g via INTRAVENOUS
  Filled 2020-03-13: qty 1
  Filled 2020-03-13: qty 10
  Filled 2020-03-13: qty 1
  Filled 2020-03-13: qty 10

## 2020-03-13 MED ORDER — LACTATED RINGERS IV BOLUS
1000.0000 mL | Freq: Once | INTRAVENOUS | Status: AC
  Administered 2020-03-13: 1000 mL via INTRAVENOUS

## 2020-03-13 MED ORDER — ONDANSETRON HCL 4 MG PO TABS: 4.0000 mg | ORAL_TABLET | Freq: Four times a day (QID) | ORAL | Status: DC | PRN

## 2020-03-13 MED ORDER — SODIUM CHLORIDE 0.9 % IV SOLN: INTRAVENOUS | Status: DC

## 2020-03-13 MED ORDER — LINAGLIPTIN 5 MG PO TABS
5.0000 mg | ORAL_TABLET | Freq: Every day | ORAL | Status: DC
  Administered 2020-03-14 – 2020-03-17 (×4): 5 mg via ORAL
  Filled 2020-03-13 (×4): qty 1

## 2020-03-13 MED ORDER — ASCORBIC ACID 500 MG PO TABS
1000.0000 mg | ORAL_TABLET | Freq: Every day | ORAL | Status: DC
  Administered 2020-03-14 – 2020-03-17 (×4): 1000 mg via ORAL
  Filled 2020-03-13 (×4): qty 2

## 2020-03-13 MED ORDER — SIMVASTATIN 10 MG PO TABS
10.0000 mg | ORAL_TABLET | Freq: Every day | ORAL | Status: DC
  Administered 2020-03-13 – 2020-03-16 (×4): 10 mg via ORAL
  Filled 2020-03-13 (×4): qty 1

## 2020-03-13 MED ORDER — POLYETHYLENE GLYCOL 3350 17 G PO PACK
17.0000 g | PACK | Freq: Two times a day (BID) | ORAL | Status: DC
  Filled 2020-03-13 (×2): qty 1

## 2020-03-13 MED ORDER — HEPARIN (PORCINE) 25000 UT/250ML-% IV SOLN
850.0000 [IU]/h | INTRAVENOUS | Status: DC
  Administered 2020-03-13: 850 [IU]/h via INTRAVENOUS
  Filled 2020-03-13: qty 250

## 2020-03-13 MED ORDER — SODIUM CHLORIDE 0.9 % IV BOLUS
1000.0000 mL | Freq: Once | INTRAVENOUS | Status: AC
  Administered 2020-03-13: 15:00:00 1000 mL via INTRAVENOUS

## 2020-03-13 NOTE — ED Notes (Signed)
Admitting provider at bedside.

## 2020-03-13 NOTE — H&P (Signed)
History and Physical    Kathryn Bradford VPX:[id number] DOB: February 14, 1930 DOA: 03/13/2020  PCP: [facility]   Patient coming from: [facility].  I have personally briefly reviewed patient's old medical records in [facility]  Chief Complaint: AMS.  HPI: Kathryn Bradford is a 86 y.o. female with medical history significant of type 2 diabetes, bowel obstruction, diverticulosis/diverticulitis, glaucoma, hernia, hypertension, pulmonary embolus and with pulmonary infarct who is brought to the emergency department via EMS from her nursing facility due to having a syncopal episode earlier today apparently after she had a bowel movement.  She is unable to provide history.  The facility report her earlier.the patient had a COVID-19 about a month ago.  She has been transferred from the Covid floor to the general area.  However, the staff reports that over the last week she has been having increased oxygen requirement with hypoxic episodes with an O2 saturation registering in the 80s.  Her oral intake has been significantly decreased over the past week as well.  She has been treated for an UTI was oral ciprofloxacin for the past 3 days.  When asked, the patient denied any pain or any other acute complaints.  ED Course: Initial vital signs were temperature 97.8 F, pulse 55, respiration 15, blood pressure 98/60 mmHg and O2 sat 100% on nasal cannula at 3 LPM.  The patient received a 1 dialysis then mL NS bolus, a 1000 LR bolus and 1 g of ceftriaxone IVPB.  Urinalysis had a turbid appearance with large hemoglobinuria, moderate leukocyte esterase, proteinuria 100 mg/dL.  She had a more than 50 RBC and more than 50 WBC with rare bacteria on microscopic urine exam.  Her CBC showed a white count is 8.1 with 87% neutrophils, following 12.6 g/dL and platelets 462.  PT was 12.9, INR 1.0 and APTT less than 20.  D-dimer was over 20.  Lactic acid was normal.  Troponin was 59 ng/L.  CMP shows a sodium  136, potassium 5.1, chloride 96 and CO2 29 mmol/L.  Glucose 178, BUN 94 and creatinine 3.41 mg/dL.  Total protein is 8.3 and albumin 3.2 g/dL.  Transaminases are normal.  Total bilirubin was 1.4 mg/dL.  Imaging: Chest radiograph did not show any acute abnormality.  CT head did not show any acute intracranial pathology.  Please see images and full radiology report for further detail.  Review of Systems: As per HPI otherwise 10 point review of systems negative.   Past Medical History:  Diagnosis Date  . Bowel obstruction (HCC)   . Diabetes mellitus without complication (HCC)   . Diverticulitis   . Glaucoma   . Hernia   . Hypertension   . Pulmonary emboli [facility])     Past Surgical History:  Procedure Laterality Date  . HERNIA REPAIR       reports that she has never smoked. She has never used smokeless tobacco. She reports that she does not drink alcohol or use drugs.  Allergies  Allergen Reactions  . Penicillins Hives and Itching    Did it involve swelling of the face/tongue/throat, SOB, or low BP? No Did it involve sudden or severe rash/hives, skin peeling, or any reaction on the inside of your mouth or nose? Yes Did you need to seek medical attention at a hospital or doctor's office? Unknown When did it last happen?Unknown (on MAR) If all above answers are "NO", may proceed with cephalosporin use.     Family History  Problem Relation Age of  Onset  . Hypertension Other    Prior to Admission medications   Medication Sig Start Date End Date Taking? Authorizing Provider  acetaminophen (TYLENOL) 500 MG tablet Take 1,000 mg by mouth daily.   Yes [provider]  ascorbic acid (VITAMIN C) 500 MG tablet Take 1,000 mg by mouth daily.   Yes [provider]  brimonidine (ALPHAGAN P) 0.1 % SOLN Place 1 drop into both eyes 2 (two) times daily.    Yes [provider]  Cholecalciferol (VITAMIN D3) 50 MCG (2000 UT) TABS Take 50 mcg by mouth daily.   Yes  [provider]  diltiazem (TIAZAC) 180 MG 24 hr capsule Take 180 mg by mouth daily.    Yes [provider]  dorzolamide-timolol (COSOPT) 22.3-6.8 MG/ML ophthalmic solution Place 1 drop into both eyes 2 (two) times daily.   Yes [provider]  dronabinol (MARINOL) 2.5 MG capsule Take 2.5 mg by mouth 2 (two) times daily. For appetite stimulation 02/16/20 03/16/20 Yes [provider]  lisinopril (ZESTRIL) 2.5 MG tablet Take 2.5 mg by mouth daily.   Yes [provider]  magnesium oxide (MAG-OX) 400 MG tablet Take 400 mg by mouth at bedtime.    Yes [provider]  polyethylene glycol (MIRALAX / GLYCOLAX) 17 g packet Take 17 g by mouth 2 (two) times daily. 02/22/20 03/16/20 Yes [provider]  rivaroxaban (XARELTO) 20 MG TABS tablet Take 20 mg by mouth daily with supper.   Yes [provider]  saxagliptin HCl (ONGLYZA) 5 MG TABS tablet Take 5 mg by mouth daily.  06/19/16  Yes [provider]  sennosides-docusate sodium (SENOKOT-S) 8.6-50 MG tablet Take 2 tablets by mouth 2 (two) times daily as needed for constipation.   Yes [provider]  simvastatin (ZOCOR) 10 MG tablet Take 10 mg by mouth at bedtime.   Yes [provider]  solifenacin (VESICARE) 10 MG tablet Take 10 mg by mouth daily.  06/19/16  Yes [provider]  zinc gluconate 50 MG tablet Take 50 mg by mouth daily.   Yes [provider]  lidocaine (LIDODERM) 5 % Place 1 patch onto the skin daily. Remove & Discard patch within 12 hours or as directed by MD Patient not taking: Reported on 03/13/2020 03/12/18   Blanchie Dessert, MD  metroNIDAZOLE (FLAGYL) 500 MG tablet Take 1 tablet (500 mg total) by mouth 2 (two) times daily. Patient not taking: Reported on 03/13/2020 02/15/13   Varney Biles, MD  ondansetron (ZOFRAN ODT) 8 MG disintegrating tablet Take 1 tablet (8 mg total) by mouth every 8 (eight) hours as needed for nausea. Patient not  taking: Reported on 07/24/2016 02/14/13   Pattricia Boss, MD  ondansetron (ZOFRAN ODT) 8 MG disintegrating tablet Take 1 tablet (8 mg total) by mouth every 8 (eight) hours as needed for nausea. Patient not taking: Reported on 07/24/2016 02/15/13   Varney Biles, MD    Physical Exam: Vitals:   03/13/20 1517 03/13/20 1552 03/13/20 1600 03/13/20 1630  BP: (!) 93/57  125/69 99/64  Pulse:    92  Resp:   16 15  Temp:  (!) 97.4 F (36.3 C)    TempSrc:  Rectal    SpO2:   92% 93%  Weight:      Height:        Constitutional: Frail, elderly female currently in NAD, calm, comfortable Eyes: PERRL, lids and conjunctivae normal ENMT: Mucous membranes are very dry.  Dentition is absent.  Posterior  pharynx clear of any exudate or lesions. Neck: normal, supple, no masses, no thyromegaly Respiratory: Decreased breath sounds on bases, otherwise clear to auscultation bilaterally, no wheezing, no crackles. Normal respiratory effort. No accessory muscle use.  Cardiovascular: Regular rate and rhythm, no murmurs / rubs / gallops. No extremity edema. 2+ pedal pulses. No carotid bruits.  Abdomen: Nondistended.  BS positive.  Soft, no tenderness, no masses palpated. No hepatosplenomegaly.  Musculoskeletal: Generalized/nonfocal weakness.  No clubbing / cyanosis. Good ROM, no contractures. Normal muscle tone.  Skin: no rashes, lesions, ulcers. No induration Neurologic: CN 2-12 grossly intact. Sensation intact, DTR normal.  Generalized weakness. Psychiatric: Alert and oriented x 2, knows she is at [facility], disoriented to time and situation.  Labs on Admission: I have personally reviewed following labs and imaging studies  CBC: Recent Labs  Lab 03/13/20 1417  WBC 8.1  NEUTROABS 7.0  HGB 12.6  HCT 39.5  MCV 97.8  PLT 140*   Basic Metabolic Panel: Recent Labs  Lab 03/13/20 1417  NA 136  K 5.1  CL 96*  CO2 29  GLUCOSE 178*  BUN 94*  CREATININE 3.41*  CALCIUM 9.9   GFR: Estimated Creatinine  Clearance: 10.2 mL/min (A) (by C-G formula based on SCr of 3.41 mg/dL (H)). Liver Function Tests: Recent Labs  Lab 03/13/20 1417  AST 21  ALT 17  ALKPHOS 56  BILITOT 1.4*  PROT 8.3*  ALBUMIN 3.2*   No results for input(s): LIPASE, AMYLASE in the last 168 hours. No results for input(s): AMMONIA in the last 168 hours. Coagulation Profile: Recent Labs  Lab 03/13/20 1417  INR 1.0   Cardiac Enzymes: No results for input(s): CKTOTAL, CKMB, CKMBINDEX, TROPONINI in the last 168 hours. BNP (last 3 results) No results for input(s): PROBNP in the last 8760 hours. HbA1C: No results for input(s): HGBA1C in the last 72 hours. CBG: No results for input(s): GLUCAP in the last 168 hours. Lipid Profile: No results for input(s): CHOL, HDL, LDLCALC, TRIG, CHOLHDL, LDLDIRECT in the last 72 hours. Thyroid Function Tests: No results for input(s): TSH, T4TOTAL, FREET4, T3FREE, THYROIDAB in the last 72 hours. Anemia Panel: No results for input(s): VITAMINB12, FOLATE, FERRITIN, TIBC, IRON, RETICCTPCT in the last 72 hours. Urine analysis:    Component Value Date/Time   COLORURINE YELLOW 03/13/2020 1340   APPEARANCEUR TURBID (A) 03/13/2020 1340   LABSPEC 1.020 03/13/2020 1340   PHURINE 5.0 03/13/2020 1340   GLUCOSEU NEGATIVE 03/13/2020 1340   HGBUR LARGE (A) 03/13/2020 1340   BILIRUBINUR NEGATIVE 03/13/2020 1340   KETONESUR NEGATIVE 03/13/2020 1340   PROTEINUR 100 (A) 03/13/2020 1340   UROBILINOGEN 0.2 08/16/2013 2100   NITRITE NEGATIVE 03/13/2020 1340   LEUKOCYTESUR MODERATE (A) 03/13/2020 1340    Radiological Exams on Admission: CT Head Wo Contrast  Result Date: 03/13/2020 CLINICAL DATA:  Altered mental status EXAM: CT HEAD WITHOUT CONTRAST TECHNIQUE: Contiguous axial images were obtained from the base of the skull through the vertex without intravenous contrast. COMPARISON:  None. FINDINGS: Brain: There is atrophy and chronic small vessel disease changes. No acute intracranial  abnormality. Specifically, no hemorrhage, hydrocephalus, mass lesion, acute infarction, or significant intracranial injury. Vascular: No hyperdense vessel or unexpected calcification. Skull: No acute calvarial abnormality. Sinuses/Orbits: Visualized paranasal sinuses and mastoids clear. Orbital soft tissues unremarkable. Other: None IMPRESSION: Atrophy, chronic microvascular disease. No acute intracranial abnormality. Electronically Signed   By: Charlett Nose M.D.   On: 03/13/2020 15:17   DG Chest Port 1 View  Result  Date: 03/13/2020 CLINICAL DATA:  Syncope. EXAM: PORTABLE CHEST 1 VIEW COMPARISON:  February 11, 2020. FINDINGS: Stable cardiomediastinal silhouette. No pneumothorax or pleural effusion is noted. Lungs are clear. Bony thorax is unremarkable. IMPRESSION: No acute cardiopulmonary abnormality seen. Electronically Signed   By: Lupita Raider M.D.   On: 03/13/2020 15:27    EKG: Independently reviewed.  Vent. rate 69 BPM PR interval * ms QRS duration 89 ms QT/QTc 431/462 ms P-R-T axes 45 22 53 Sinus rhythm Atrial premature complex  Assessment/Plan Principal Problem:   AKI (acute kidney injury) (HCC) Admit to telemetry/inpatient. Continue IV hydration. Hold lisinopril. Monitor intake and output. Follow renal function electrolytes.  Active Problems:   Acute lower UTI Continue ceftriaxone 1 g IVPB every 24 hours. Follow-up blood culture and sensitivity. Follow urine culture and sensitivity.    Syncope Likely due to dehydration/hypotension. Continue IV hydration. Hold antihypertensives. Check echocardiogram.    Pulmonary emboli history (HCC) Hold Xarelto due to AKI. Begin heparin per pharmacy. Check VQ scan. Check echocardiogram.    Type 2 diabetes mellitus (HCC) Carbohydrate modified diet. Tradjenta 5 mg p.o. daily. CBG monitoring with RI SS.    Hypertension Hold Cardizem 180 mg p.o. daily. Hold lisinopril 2.5 mg p.o. daily.    Glaucoma Continue Alphagan  drops. Hold Cosopt drops due to AKI.    Elevated bilirubin Likely due to dehydration. Follow-up level in a.m.    Elevated troponin Likely due to AKI. Follow level given history of PE.    Chronic constipation Continue mag oxide 400 mg p.o. daily. Continue MiraLAX p.o. twice a day. Continue Senokot p.o. twice daily as needed.   DVT prophylaxis: Heparin infusion. Code Status: Full code. Family Communication: Disposition Plan: Admit for IV hydration, IV antibiotics, further treatment and work-up. Consults called: Admission status: Inpatient/telemetry.   Bobette Mo MD [facility]  If 7PM-7AM, please contact night-coverage [email or web address]  03/13/2020, 5:23 PM   This document has been prepared using Dragon voice recognition software and may contain some unintended transcription errors.

## 2020-03-13 NOTE — Progress Notes (Signed)
CRITICAL VALUE ALERT  Critical Value:  Troponin 115  Date & Time Notied:  03/13/20@2108   Provider Notified: Yes  Orders Received/Actions taken: yes

## 2020-03-13 NOTE — Progress Notes (Signed)
ANTICOAGULATION CONSULT NOTE - Initial Consult  Pharmacy Consult for IV heparin Indication: pulmonary embolus  Allergies  Allergen Reactions  . Penicillins Hives and Itching    Did it involve swelling of the face/tongue/throat, SOB, or low BP? No Did it involve sudden or severe rash/hives, skin peeling, or any reaction on the inside of your mouth or nose? Yes Did you need to seek medical attention at a hospital or doctor's office? Unknown When did it last happen?Unknown (on MAR) If all above answers are "NO", may proceed with cephalosporin use.     Patient Measurements: Height: 5\' 5"  (165.1 cm) Weight: 56.6 kg (124 lb 12.5 oz) IBW/kg (Calculated) : 57 Heparin Dosing Weight: 56.6  Vital Signs: Temp: 97.6 F (36.4 C) (04/05 1802) Temp Source: Oral (04/05 1802) BP: 120/81 (04/05 1802) Pulse Rate: 91 (04/05 1802)  Labs: Recent Labs    03/13/20 1417 03/13/20 1655 03/13/20 1905  HGB 12.6  --   --   HCT 39.5  --   --   PLT 140*  --   --   APTT <20*  --   --   LABPROT 12.9  --   --   INR 1.0  --   --   HEPARINUNFRC  --   --  <0.10*  CREATININE 3.41*  --   --   TROPONINIHS 59* 84*  --     Estimated Creatinine Clearance: 10.4 mL/min (A) (by C-G formula based on SCr of 3.41 mg/dL (H)).   Medical History: Past Medical History:  Diagnosis Date  . Bowel obstruction (HCC)   . Diabetes mellitus without complication (HCC)   . Diverticulitis   . Glaucoma   . Hernia   . Hypertension   . Pulmonary emboli (HCC)     Medications:  Scheduled:  . [START ON 03/14/2020] ascorbic acid  1,000 mg Oral Daily  . brimonidine  1 drop Both Eyes BID  . dronabinol  2.5 mg Oral BID AC  . [START ON 03/14/2020] insulin aspart  0-9 Units Subcutaneous TID WC  . [START ON 03/14/2020] linagliptin  5 mg Oral Daily  . magnesium oxide  400 mg Oral QHS  . polyethylene glycol  17 g Oral BID  . simvastatin  10 mg Oral QHS  . [START ON 03/14/2020] zinc sulfate  220 mg Oral Daily   Infusions:  .  sodium chloride    . [START ON 03/14/2020] cefTRIAXone (ROCEPHIN)  IV      Assessment: 86 yo female presented to ED with AMS found to have acute kidney injury to hold patient's PTA Xarelto due to this. Patient with hx PE supposedly on Xarelto prior to admission at SNF; however, patient's baseline heparin level which would be elevated if patient was actually taking Xarelto is undetectable meaning she is most likely not taking this medication. Baseline CBC - Hgb 12.6 and Plts 140k - monitor closely. Baseline ddimer > 20  Goal of Therapy:  Heparin level 0.3-0.7 units/ml Monitor platelets by anticoagulation protocol: Yes   Plan:   IV heparin bolus of 1500 units then  IV heparin rate of 850 units/hr  Check heparin level 8 hours after start of IV heparin  Daily CBC  97 03/13/2020,8:28 PM

## 2020-03-13 NOTE — ED Provider Notes (Signed)
[facility]-EMERGENCY DEPT Provider Note   CSN: [id number] Arrival date & time: 03/13/20  1309  LEVEL 5 CAVEAT - ALTERED MENTAL STATUS  History Chief Complaint  Patient presents with  . Hypotension  . Altered Mental Status    Kathryn Bradford is a 86 y.o. female.  HPI 86 year old female presents with a syncopal episode.  History is very limited as the patient is confused as to what is going on.  She is oriented to herself only.  Discussed with staff at [facility], who states that she recently had Covid about a month ago.  Has come off the Covid floor.  Over the last week or so she has been requiring oxygen with O2 sats dropping into the 80s.  She is telling me that she has some chest pressure right now.  Apparently she walked to the bathroom, had a bowel movement, and then passed out briefly.  She was confused when she awoke.  Confusion has been on and off over the last week as well.  Did not have any blood in her stool and it was light brown.  Has been treated for a UTI and has been on Cipro over the last 3 days.  Decreased p.o. intake.  Past Medical History:  Diagnosis Date  . Bowel obstruction (HCC)   . Diabetes mellitus without complication (HCC)   . Diverticulitis   . Glaucoma   . Hernia   . Hypertension   . Pulmonary emboli [facility])     Patient Active Problem List   Diagnosis Date Noted  . AKI (acute kidney injury) (HCC) 03/13/2020  . Incisional hernia with gangrene and obstruction 10/05/2013  . Acute posthemorrhagic anemia 10/05/2013  . Type II or unspecified type diabetes mellitus without mention of complication, not stated as uncontrolled 10/05/2013  . Phlebitis and thrombophlebitis of other deep vessels of lower extremities 10/05/2013  . Other pulmonary embolism and infarction 10/05/2013    Past Surgical History:  Procedure Laterality Date  . HERNIA REPAIR       OB History   No obstetric history on file.     No family history on  file.  Social History   Tobacco Use  . Smoking status: Never Smoker  . Smokeless tobacco: Never Used  Substance Use Topics  . Alcohol use: No  . Drug use: No    Home Medications Prior to Admission medications   Medication Sig Start Date End Date Taking? Authorizing Provider  acetaminophen (TYLENOL) 500 MG tablet Take 1,000 mg by mouth daily.   Yes [provider]  ascorbic acid (VITAMIN C) 500 MG tablet Take 1,000 mg by mouth daily.   Yes [provider]  brimonidine (ALPHAGAN P) 0.1 % SOLN Place 1 drop into both eyes 2 (two) times daily.    Yes [provider]  Cholecalciferol (VITAMIN D3) 50 MCG (2000 UT) TABS Take 50 mcg by mouth daily.   Yes [provider]  diltiazem (TIAZAC) 180 MG 24 hr capsule Take 180 mg by mouth daily.    Yes [provider]  dorzolamide-timolol (COSOPT) 22.3-6.8 MG/ML ophthalmic solution Place 1 drop into both eyes 2 (two) times daily.   Yes [provider]  dronabinol (MARINOL) 2.5 MG capsule Take 2.5 mg by mouth 2 (two) times daily. For appetite stimulation 02/16/20 03/16/20 Yes [provider]  lisinopril (ZESTRIL) 2.5 MG tablet Take 2.5 mg by mouth daily.   Yes [provider]  magnesium oxide (MAG-OX) 400 MG  tablet Take 400 mg by mouth at bedtime.    Yes [provider]  polyethylene glycol (MIRALAX / GLYCOLAX) 17 g packet Take 17 g by mouth 2 (two) times daily. 02/22/20 03/16/20 Yes [provider]  rivaroxaban (XARELTO) 20 MG TABS tablet Take 20 mg by mouth daily with supper.   Yes [provider]  saxagliptin HCl (ONGLYZA) 5 MG TABS tablet Take 5 mg by mouth daily.  06/19/16  Yes [provider]  sennosides-docusate sodium (SENOKOT-S) 8.6-50 MG tablet Take 2 tablets by mouth 2 (two) times daily as needed for constipation.   Yes [provider]  simvastatin (ZOCOR) 10 MG tablet Take 10 mg by mouth at bedtime.   Yes [provider]    solifenacin (VESICARE) 10 MG tablet Take 10 mg by mouth daily.  06/19/16  Yes [provider]  zinc gluconate 50 MG tablet Take 50 mg by mouth daily.   Yes [provider]  lidocaine (LIDODERM) 5 % Place 1 patch onto the skin daily. Remove & Discard patch within 12 hours or as directed by MD Patient not taking: Reported on 03/13/2020 03/12/18   Gwyneth Sprout, MD  metroNIDAZOLE (FLAGYL) 500 MG tablet Take 1 tablet (500 mg total) by mouth 2 (two) times daily. Patient not taking: Reported on 03/13/2020 02/15/13   Derwood Kaplan, MD  ondansetron (ZOFRAN ODT) 8 MG disintegrating tablet Take 1 tablet (8 mg total) by mouth every 8 (eight) hours as needed for nausea. Patient not taking: Reported on 07/24/2016 02/14/13   Margarita Grizzle, MD  ondansetron (ZOFRAN ODT) 8 MG disintegrating tablet Take 1 tablet (8 mg total) by mouth every 8 (eight) hours as needed for nausea. Patient not taking: Reported on 07/24/2016 02/15/13   Derwood Kaplan, MD    Allergies    Penicillins  Review of Systems   Review of Systems  Unable to perform ROS: Mental status change    Physical Exam Updated Vital Signs BP 99/64   Pulse 92   Temp (!) 97.4 F (36.3 C) (Rectal)   Resp 15   Ht 5\' 5"  (1.651 m)   Wt 55.6 kg   SpO2 93%   BMI 20.40 kg/m   Physical Exam Vitals and nursing note reviewed.  Constitutional:      General: She is not in acute distress.    Appearance: She is well-developed. She is not ill-appearing or diaphoretic.  HENT:     Head: Normocephalic and atraumatic.     Right Ear: External ear normal.     Left Ear: External ear normal.     Nose: Nose normal.     Mouth/Throat:     Mouth: Mucous membranes are dry.  Eyes:     General:        Right eye: No discharge.        Left eye: No discharge.  Cardiovascular:     Rate and Rhythm: Normal rate and regular rhythm.     Heart sounds: Normal heart sounds.  Pulmonary:     Effort: Pulmonary effort is normal.     Breath sounds: Normal  breath sounds.  Abdominal:     General: There is no distension.     Palpations: Abdomen is soft.     Tenderness: There is no abdominal tenderness.  Skin:    General: Skin is warm and dry.  Neurological:     Mental Status: She is alert. She is disoriented.     Comments: Awake, alert, oriented to self. CN 3-12  grossly intact. 5/5 strength in all 4 extremities. Grossly normal sensation.  Psychiatric:        Mood and Affect: Mood is not anxious.     ED Results / Procedures / Treatments   Labs (all labs ordered are listed, but only abnormal results are displayed) Labs Reviewed  D-DIMER, QUANTITATIVE (NOT AT [facility]) - Abnormal; Notable for the following components:      Result Value   D-Dimer, Quant >20.00 (*)    All other components within normal limits  COMPREHENSIVE METABOLIC PANEL - Abnormal; Notable for the following components:   Chloride 96 (*)    Glucose, Bld 178 (*)    BUN 94 (*)    Creatinine, Ser 3.41 (*)    Total Protein 8.3 (*)    Albumin 3.2 (*)    Total Bilirubin 1.4 (*)    GFR calc non Af Amer 11 (*)    GFR calc Af Amer 13 (*)    All other components within normal limits  CBC WITH DIFFERENTIAL/PLATELET - Abnormal; Notable for the following components:   RDW 16.0 (*)    Platelets 140 (*)    Lymphs Abs 0.5 (*)    All other components within normal limits  APTT - Abnormal; Notable for the following components:   aPTT <20 (*)    All other components within normal limits  URINALYSIS, ROUTINE W REFLEX MICROSCOPIC - Abnormal; Notable for the following components:   APPearance TURBID (*)    Hgb urine dipstick LARGE (*)    Protein, ur 100 (*)    Leukocytes,Ua MODERATE (*)    RBC / HPF >50 (*)    WBC, UA >50 (*)    Bacteria, UA RARE (*)    All other components within normal limits  TROPONIN I (HIGH SENSITIVITY) - Abnormal; Notable for the following components:   Troponin I (High Sensitivity) 59 (*)    All other components within normal limits  CULTURE, BLOOD  (ROUTINE X 2)  CULTURE, BLOOD (ROUTINE X 2)  URINE CULTURE  LACTIC ACID, PLASMA  PROTIME-INR  TROPONIN I (HIGH SENSITIVITY)    EKG EKG Interpretation  Date/Time:  Monday March 13 2020 13:22:22 EDT Ventricular Rate:  69 PR Interval:    QRS Duration: 89 QT Interval:  431 QTC Calculation: 462 R Axis:   22 Text Interpretation: Sinus rhythm Atrial premature complex no acute ST/T changes Confirmed by Pricilla Loveless [phone]) on 03/13/2020 1:49:43 PM   Radiology CT Head Wo Contrast  Result Date: 03/13/2020 CLINICAL DATA:  Altered mental status EXAM: CT HEAD WITHOUT CONTRAST TECHNIQUE: Contiguous axial images were obtained from the base of the skull through the vertex without intravenous contrast. COMPARISON:  None. FINDINGS: Brain: There is atrophy and chronic small vessel disease changes. No acute intracranial abnormality. Specifically, no hemorrhage, hydrocephalus, mass lesion, acute infarction, or significant intracranial injury. Vascular: No hyperdense vessel or unexpected calcification. Skull: No acute calvarial abnormality. Sinuses/Orbits: Visualized paranasal sinuses and mastoids clear. Orbital soft tissues unremarkable. Other: None IMPRESSION: Atrophy, chronic microvascular disease. No acute intracranial abnormality. Electronically Signed   By: Charlett Nose M.D.   On: 03/13/2020 15:17   DG Chest Port 1 View  Result Date: 03/13/2020 CLINICAL DATA:  Syncope. EXAM: PORTABLE CHEST 1 VIEW COMPARISON:  February 11, 2020. FINDINGS: Stable cardiomediastinal silhouette. No pneumothorax or pleural effusion is noted. Lungs are clear. Bony thorax is unremarkable. IMPRESSION: No acute cardiopulmonary abnormality seen. Electronically Signed   By: Lupita Raider M.D.   On: 03/13/2020 15:27  Procedures Procedures (including critical care time)  Medications Ordered in ED Medications  lactated ringers bolus 1,000 mL (has no administration in time range)  cefTRIAXone (ROCEPHIN) 1 g in sodium chloride  0.9 % 100 mL IVPB (has no administration in time range)  sodium chloride 0.9 % bolus 1,000 mL (0 mLs Intravenous Stopped 03/13/20 1624)    ED Course  I have reviewed the triage vital signs and the nursing notes.  Pertinent labs & imaging results that were available during my care of the patient were reviewed by me and considered in my medical decision making (see chart for details).    MDM Rules/Calculators/A&P                      Unfortunately the history is pretty limited as the patient is altered, family does not answer when called (it went straight to voicemail on the daughter's phone) and the facility indicates that she has been having on description of times for a week or so.  I think her change in mental status is from the BUN of 94 and acute kidney injury.  Likely also helps explain why she passed out today.  Could be infectious given her urinalysis results so she will be given Rocephin and continue to give IV fluids.  Blood pressure has improved with some fluids.  Lactate is reassuring.  Troponin is probably more kidney injury than anything else but will need repeat testing.  She is not hypoxic here but given her chest pain/shortness of breath and syncopal episode, she was being worked up for PE.  Unfortunately despite her D-dimer being quite high we cannot get CTA because of her renal function.  Discussed with with Dr. Olevia Bowens.  She also had Covid and was tested positive on 3/5 so repeat testing is not indicated at this time.  Lauri Buda was evaluated in Emergency Department on 03/13/2020 for the symptoms described in the history of present illness. She was evaluated in the context of the global COVID-19 pandemic, which necessitated consideration that the patient might be at risk for infection with the SARS-CoV-2 virus that causes COVID-19. Institutional protocols and algorithms that pertain to the evaluation of patients at risk for COVID-19 are in a state of rapid change based on  information released by regulatory bodies including the CDC and federal and state organizations. These policies and algorithms were followed during the patient's care in the ED.  Final Clinical Impression(s) / ED Diagnoses Final diagnoses:  Acute kidney injury ([place])  Acute UTI (urinary tract infection)    Rx / DC Orders ED Discharge Orders    None       Sherwood Gambler, MD 03/13/20 [phone]

## 2020-03-13 NOTE — ED Triage Notes (Signed)
Pt BIBA from [facility]-   Per EMS-  Staff reports pt with AMS, last seen normal Friday. Pt AOx1.  Unclear to what pts baseline mentation is. No neuro deficits, pt denies cp, denies SHOB. Pt reports poor PO intake due to "this place gives you the same thing every day, im tired of eating it."   Pt was sent to [facility] for COVID tx. Family is now seeking new placement d/t being unable to care for her at home.

## 2020-03-14 ENCOUNTER — Inpatient Hospital Stay (HOSPITAL_COMMUNITY): Payer: Medicare Other

## 2020-03-14 DIAGNOSIS — K56609 Unspecified intestinal obstruction, unspecified as to partial versus complete obstruction: Secondary | ICD-10-CM | POA: Diagnosis present

## 2020-03-14 DIAGNOSIS — Z86711 Personal history of pulmonary embolism: Secondary | ICD-10-CM

## 2020-03-14 DIAGNOSIS — R55 Syncope and collapse: Secondary | ICD-10-CM

## 2020-03-14 DIAGNOSIS — Z7901 Long term (current) use of anticoagulants: Secondary | ICD-10-CM

## 2020-03-14 DIAGNOSIS — K5790 Diverticulosis of intestine, part unspecified, without perforation or abscess without bleeding: Secondary | ICD-10-CM | POA: Diagnosis present

## 2020-03-14 DIAGNOSIS — I10 Essential (primary) hypertension: Secondary | ICD-10-CM

## 2020-03-14 DIAGNOSIS — E1165 Type 2 diabetes mellitus with hyperglycemia: Secondary | ICD-10-CM | POA: Diagnosis present

## 2020-03-14 DIAGNOSIS — I82401 Acute embolism and thrombosis of unspecified deep veins of right lower extremity: Secondary | ICD-10-CM

## 2020-03-14 DIAGNOSIS — I739 Peripheral vascular disease, unspecified: Secondary | ICD-10-CM | POA: Diagnosis present

## 2020-03-14 DIAGNOSIS — I2601 Septic pulmonary embolism with acute cor pulmonale: Secondary | ICD-10-CM

## 2020-03-14 DIAGNOSIS — N1832 Chronic kidney disease, stage 3b: Secondary | ICD-10-CM

## 2020-03-14 DIAGNOSIS — N390 Urinary tract infection, site not specified: Principal | ICD-10-CM

## 2020-03-14 DIAGNOSIS — K5909 Other constipation: Secondary | ICD-10-CM

## 2020-03-14 DIAGNOSIS — I5032 Chronic diastolic (congestive) heart failure: Secondary | ICD-10-CM

## 2020-03-14 DIAGNOSIS — E785 Hyperlipidemia, unspecified: Secondary | ICD-10-CM | POA: Diagnosis present

## 2020-03-14 DIAGNOSIS — I351 Nonrheumatic aortic (valve) insufficiency: Secondary | ICD-10-CM | POA: Diagnosis present

## 2020-03-14 DIAGNOSIS — I82409 Acute embolism and thrombosis of unspecified deep veins of unspecified lower extremity: Secondary | ICD-10-CM | POA: Diagnosis present

## 2020-03-14 DIAGNOSIS — E118 Type 2 diabetes mellitus with unspecified complications: Secondary | ICD-10-CM

## 2020-03-14 DIAGNOSIS — Z8719 Personal history of other diseases of the digestive system: Secondary | ICD-10-CM

## 2020-03-14 DIAGNOSIS — I2699 Other pulmonary embolism without acute cor pulmonale: Secondary | ICD-10-CM

## 2020-03-14 DIAGNOSIS — IMO0002 Reserved for concepts with insufficient information to code with codable children: Secondary | ICD-10-CM | POA: Diagnosis present

## 2020-03-14 DIAGNOSIS — Z8709 Personal history of other diseases of the respiratory system: Secondary | ICD-10-CM

## 2020-03-14 DIAGNOSIS — N183 Chronic kidney disease, stage 3 unspecified: Secondary | ICD-10-CM | POA: Diagnosis present

## 2020-03-14 DIAGNOSIS — R17 Unspecified jaundice: Secondary | ICD-10-CM

## 2020-03-14 LAB — PHOSPHORUS: Phosphorus: 3.3 mg/dL (ref 2.5–4.6)

## 2020-03-14 LAB — COMPREHENSIVE METABOLIC PANEL
ALT: 12 U/L (ref 0–44)
ALT: 12 U/L (ref 0–44)
AST: 16 U/L (ref 15–41)
AST: 20 U/L (ref 15–41)
Albumin: 2.2 g/dL — ABNORMAL LOW (ref 3.5–5.0)
Albumin: 2.5 g/dL — ABNORMAL LOW (ref 3.5–5.0)
Alkaline Phosphatase: 43 U/L (ref 38–126)
Alkaline Phosphatase: 49 U/L (ref 38–126)
Anion gap: 7 (ref 5–15)
Anion gap: 6 (ref 5–15)
BUN: 73 mg/dL — ABNORMAL HIGH (ref 8–23)
BUN: 69 mg/dL — ABNORMAL HIGH (ref 8–23)
CO2: 23 mmol/L (ref 22–32)
CO2: 26 mmol/L (ref 22–32)
Calcium: 8.7 mg/dL — ABNORMAL LOW (ref 8.9–10.3)
Calcium: 8.9 mg/dL (ref 8.9–10.3)
Chloride: 106 mmol/L (ref 98–111)
Chloride: 104 mmol/L (ref 98–111)
Creatinine, Ser: 1.93 mg/dL — ABNORMAL HIGH (ref 0.44–1.00)
Creatinine, Ser: 1.71 mg/dL — ABNORMAL HIGH (ref 0.44–1.00)
GFR calc Af Amer: 26 mL/min — ABNORMAL LOW (ref >60)
GFR calc Af Amer: 31 mL/min — ABNORMAL LOW (ref >60)
GFR calc non Af Amer: 23 mL/min — ABNORMAL LOW (ref >60)
GFR calc non Af Amer: 26 mL/min — ABNORMAL LOW (ref >60)
Glucose, Bld: 134 mg/dL — ABNORMAL HIGH (ref 70–99)
Glucose, Bld: 157 mg/dL — ABNORMAL HIGH (ref 70–99)
Potassium: 4.6 mmol/L (ref 3.5–5.1)
Potassium: 4.6 mmol/L (ref 3.5–5.1)
Sodium: 136 mmol/L (ref 135–145)
Sodium: 136 mmol/L (ref 135–145)
Total Bilirubin: 0.7 mg/dL (ref 0.3–1.2)
Total Bilirubin: 0.7 mg/dL (ref 0.3–1.2)
Total Protein: 5.9 g/dL — ABNORMAL LOW (ref 6.5–8.1)
Total Protein: 6.6 g/dL (ref 6.5–8.1)

## 2020-03-14 LAB — CBC WITH DIFFERENTIAL/PLATELET
Abs Immature Granulocytes: 0.02 10*3/uL (ref 0.00–0.07)
Basophils Absolute: 0 10*3/uL (ref 0.0–0.1)
Basophils Relative: 1 %
Eosinophils Absolute: 0.2 10*3/uL (ref 0.0–0.5)
Eosinophils Relative: 3 %
HCT: 35.8 % — ABNORMAL LOW (ref 36.0–46.0)
Hemoglobin: 11.2 g/dL — ABNORMAL LOW (ref 12.0–15.0)
Immature Granulocytes: 0 %
Lymphocytes Relative: 14 %
Lymphs Abs: 0.8 10*3/uL (ref 0.7–4.0)
MCH: 30.9 pg (ref 26.0–34.0)
MCHC: 31.3 g/dL (ref 30.0–36.0)
MCV: 98.9 fL (ref 80.0–100.0)
Monocytes Absolute: 0.5 10*3/uL (ref 0.1–1.0)
Monocytes Relative: 9 %
Neutro Abs: 4.3 10*3/uL (ref 1.7–7.7)
Neutrophils Relative %: 73 %
Platelets: 137 10*3/uL — ABNORMAL LOW (ref 150–400)
RBC: 3.62 MIL/uL — ABNORMAL LOW (ref 3.87–5.11)
RDW: 16.1 % — ABNORMAL HIGH (ref 11.5–15.5)
WBC: 5.8 10*3/uL (ref 4.0–10.5)
nRBC: 0 % (ref 0.0–0.2)

## 2020-03-14 LAB — APTT: aPTT: 187 seconds (ref 24–36)

## 2020-03-14 LAB — CBC
HCT: 30.7 % — ABNORMAL LOW (ref 36.0–46.0)
Hemoglobin: 10 g/dL — ABNORMAL LOW (ref 12.0–15.0)
MCH: 31.9 pg (ref 26.0–34.0)
MCHC: 32.6 g/dL (ref 30.0–36.0)
MCV: 98.1 fL (ref 80.0–100.0)
Platelets: 119 10*3/uL — ABNORMAL LOW (ref 150–400)
RBC: 3.13 MIL/uL — ABNORMAL LOW (ref 3.87–5.11)
RDW: 16.1 % — ABNORMAL HIGH (ref 11.5–15.5)
WBC: 5.8 10*3/uL (ref 4.0–10.5)
nRBC: 0 % (ref 0.0–0.2)

## 2020-03-14 LAB — GLUCOSE, CAPILLARY
Glucose-Capillary: 124 mg/dL — ABNORMAL HIGH (ref 70–99)
Glucose-Capillary: 151 mg/dL — ABNORMAL HIGH (ref 70–99)
Glucose-Capillary: 101 mg/dL — ABNORMAL HIGH (ref 70–99)
Glucose-Capillary: 116 mg/dL — ABNORMAL HIGH (ref 70–99)

## 2020-03-14 LAB — C-REACTIVE PROTEIN: CRP: 1.1 mg/dL — ABNORMAL HIGH (ref <1.0)

## 2020-03-14 LAB — HEPARIN LEVEL (UNFRACTIONATED)
Heparin Unfractionated: 0.92 IU/mL — ABNORMAL HIGH (ref 0.30–0.70)
Heparin Unfractionated: 0.87 IU/mL — ABNORMAL HIGH (ref 0.30–0.70)

## 2020-03-14 LAB — HEMOGLOBIN A1C
Hgb A1c MFr Bld: 7.7 % — ABNORMAL HIGH (ref 4.8–5.6)
Mean Plasma Glucose: 174.29 mg/dL

## 2020-03-14 LAB — FERRITIN: Ferritin: 245 ng/mL (ref 11–307)

## 2020-03-14 LAB — MAGNESIUM: Magnesium: 3.3 mg/dL — ABNORMAL HIGH (ref 1.7–2.4)

## 2020-03-14 LAB — ECHOCARDIOGRAM COMPLETE
Height: 65 in
Weight: 1996.49 oz

## 2020-03-14 LAB — D-DIMER, QUANTITATIVE: D-Dimer, Quant: >20 ug/mL-FEU — ABNORMAL HIGH (ref 0.00–0.50)

## 2020-03-14 LAB — LACTATE DEHYDROGENASE: LDH: 189 U/L (ref 98–192)

## 2020-03-14 MED ORDER — HEPARIN (PORCINE) 25000 UT/250ML-% IV SOLN
600.0000 [IU]/h | INTRAVENOUS | Status: DC
  Administered 2020-03-14: 750 [IU]/h via INTRAVENOUS
  Administered 2020-03-15: 07:00:00 600 [IU]/h via INTRAVENOUS
  Filled 2020-03-14: qty 250

## 2020-03-14 MED ORDER — TECHNETIUM TO 99M ALBUMIN AGGREGATED
1.6000 | Freq: Once | INTRAVENOUS | Status: AC | PRN
  Administered 2020-03-14: 1.6 via INTRAVENOUS

## 2020-03-14 NOTE — Progress Notes (Signed)
CRITICAL VALUE ALERT  Critical Value:  APTT 187  Date & Time Notied:  03/14/20@0654   Provider Notified: yes  Orders Received/Actions taken: Yes

## 2020-03-14 NOTE — Progress Notes (Signed)
ANTICOAGULATION CONSULT NOTE - Follow Up Consult  Pharmacy Consult for Heparin Indication: pulmonary embolus  Allergies  Allergen Reactions  . Penicillins Hives and Itching    Did it involve swelling of the face/tongue/throat, SOB, or low BP? No Did it involve sudden or severe rash/hives, skin peeling, or any reaction on the inside of your mouth or nose? Yes Did you need to seek medical attention at a hospital or doctor's office? Unknown When did it last happen?Unknown (on MAR) If all above answers are "NO", may proceed with cephalosporin use.     Patient Measurements: Height: 5\' 5"  (165.1 cm) Weight: 56.6 kg (124 lb 12.5 oz) IBW/kg (Calculated) : 57 Heparin Dosing Weight:   Vital Signs: Temp: 97.8 F (36.6 C) (04/06 0520) Temp Source: Oral (04/06 0520) BP: 108/71 (04/06 0520) Pulse Rate: 78 (04/06 0520)  Labs: Recent Labs    03/13/20 1417 03/13/20 1655 03/13/20 1905 03/13/20 2034 03/14/20 0426  HGB 12.6  --   --   --  10.0*  HCT 39.5  --   --   --  30.7*  PLT 140*  --   --   --  119*  APTT <20*  --   --   --  187*  LABPROT 12.9  --   --   --   --   INR 1.0  --   --   --   --   HEPARINUNFRC  --   --  <0.10*  --  0.92*  CREATININE 3.41*  --   --   --  1.93*  TROPONINIHS 59* 84*  --  115*  --     Estimated Creatinine Clearance: 18.3 mL/min (A) (by C-G formula based on SCr of 1.93 mg/dL (H)).   Medications:  Infusions:  . sodium chloride 100 mL/hr at 03/13/20 2137  . cefTRIAXone (ROCEPHIN)  IV    . heparin      Assessment: Patient with high heparin level and high PTT.  No heparin issues per RN.  Will only use PTT going forward due to baseline heparin level and PTT and heparin level   Goal of Therapy:  Heparin level 0.3-0.7 units/ml Monitor platelets by anticoagulation protocol: Yes   Plan:  Hold heparin until 0800 Restart heparin at 750 units/hr Recheck heparin level at [address], [place] 03/14/2020,6:59 AM

## 2020-03-14 NOTE — Progress Notes (Signed)
Lower venous duplex       has been completed. Preliminary results can be found under CV proc through chart review. Takeshi Teasdale, BS, RDMS, RVT   

## 2020-03-14 NOTE — Progress Notes (Signed)
  Echocardiogram 2D Echocardiogram has been performed.  Kathryn Bradford 03/14/2020, 10:30 AM

## 2020-03-14 NOTE — Progress Notes (Signed)
Pharmacy - IV heparin  Assessment:    Please see note from Dellia Nims, PharmD earlier today for full details.  Briefly, 86 y.o. female on IV heparin for PE   Most recent heparin level remains SUPRAtherapeutic at 0.87 despite decrease  in rate; level drawn appropriately  No bleeding or infusion issues per RN  Plan:   Reduce heparin rate to 600 units/hr; recheck heparin level 8 hrs after rate change  Bernadene Person, PharmD, BCPS [phone] 03/14/2020, 7:47 PM

## 2020-03-14 NOTE — Progress Notes (Signed)
PROGRESS NOTE    Kathryn Bradford  HEN:[id number] DOB: 11-25-1930 DOA: 03/13/2020 PCP: [facility]     Brief Narrative:   Kathryn Bradford is a 86 y.o. BF PMHx DM Type 2 diabetes uncontrolled with complications,, SBO, Diverticulosis/Diverticulitis, glaucoma, hernia, aortic insufficiency, chronic diastolic CHF, PAD, HTN, HLD, PE, with Pulmonary infarct/DVT (on chronic anticoagulation Coumadin), CKD (baseline Cr 1.44 07/18/2017),  Brought to the emergency department via EMS from her nursing facility due to having a syncopal episode earlier today apparently after she had a bowel movement.  She is unable to provide history.  The facility report her earlier.the patient had a COVID-19 about a month ago.  She has been transferred from the Covid floor to the general area.  However, the staff reports that over the last week she has been having increased oxygen requirement with hypoxic episodes with an O2 saturation registering in the 80s.  Her oral intake has been significantly decreased over the past week as well.  She has been treated for an UTI was oral ciprofloxacin for the past 3 days.  When asked, the patient denied any pain or any other acute complaints.  ED Course: Initial vital signs were temperature 97.8 F, pulse 55, respiration 15, blood pressure 98/60 mmHg and O2 satTitrate O2 to maintain SPO2> 88% 100% on nasal cannula at 3 LPM.  The patient received a 1 dialysis then mL NS bolus, a 1000 LR bolus and 1 g of ceftriaxone IVPB.  Urinalysis had a turbid appearance with large hemoglobinuria, moderate leukocyte esterase, proteinuria 100 mg/dL.  She had a more than 50 RBC and more than 50 WBC with rare bacteria on microscopic urine exam.  Her CBC showed a white count is 8.1 with 87% neutrophils, following 12.6 g/dL and platelets 361.  PT was 12.9, INR 1.0 and APTT less than 20.  D-dimer was over 20.  Lactic acid was normal.  Troponin was 59 ng/L.  CMP shows a sodium 136, potassium 5.1, chloride  96 and CO2 29 mmol/L.  Glucose 178, BUN 94 and creatinine 3.41 mg/dL.  Total protein is 8.3 and albumin 3.2 g/dL.  Transaminases are normal.  Total bilirubin was 1.4 mg/dL.  Imaging: Chest radiograph did not show any acute abnormality.  CT head did not show any acute intracranial pathology.  Please see images and full radiology report for further detail.   Subjective: A/O x4, though patient has some confusion.  States she lives alone and afraid she is going to lose her house if she does not return soon (from [employer]).  States walks with a walker/cane.  Positive DOE, negative S OB, negative CP.  Complains of right hand pain/tingling (chronic).  Per patient negative watery stools.   Assessment & Plan:   Principal Problem:   AKI (acute kidney injury) (HCC) Active Problems:   Type 2 diabetes mellitus (HCC)   Pulmonary emboli (HCC)   Hypertension   Glaucoma   Acute lower UTI   Elevated bilirubin   Elevated troponin   Chronic constipation   Syncope   Diabetes mellitus type 2, uncontrolled, with complications (HCC)   SBO (small bowel obstruction) (HCC)   Diverticulosis   Hx of diverticulitis of colon   Aortic insufficiency   Chronic diastolic CHF (congestive heart failure) (HCC)   PAD (peripheral artery disease) (HCC)   Essential hypertension   HLD (hyperlipidemia)   Hx of pulmonary embolus   Hx of pulmonary infarction   DVT (deep venous thrombosis) (HCC)   CKD (chronic kidney disease),  stage III   Chronic anticoagulation   Acute respiratory failure with hypoxia  -Secondary to acute on chronic PE -See PE -Titrate O2 to maintain SPO2> 88% -In 48 hours order PT/OT consult.  Acute on Chronic Pulmonary Embolus -Hold Xarelto due to AKI -Echocardiogram consistent with acute PE -Awaiting VQ scan reading. -4/6 lower extremity Doppler pending -Heparin drip per pharmacy protocol  Acute on CKD stage III (baseline Cr 1.44 07/18/2017)* Recent Labs  Lab 03/13/20 1417  03/14/20 0426 03/14/20 0940  CREATININE 3.41* 1.93* 1.71*  -Avoid nephrotoxic medication -IV hydration -Normal saline 158ml/hr -Hold lisinopril  Acute UTI -Empirically treat with ceftriaxone -4/5 urine culture pending  Syncope and collapse -Multifactorial acute respiratory failure with hypoxia, PE, acute UTI -Treat underlying cause -Hold antihypertensives  DM type II uncontrolled with complication -4/6 hemoglobin A1c = 7.7 -Tradjenta 5 mg daily -Sensitive SSI  Essential HTN -Cardizem 180 mg daily (hold) -Lisinopril 2.5 mg daily (hold)  Elevated troponin -Slightly elevated most consistent with demand ischemia -Patient without CP, S OB.  Trend Results for Kathryn Bradford, Kathryn Bradford (MRN [id number]) as of 03/14/2020 23:15  Ref. Range 03/13/2020 14:17 03/13/2020 16:55 03/13/2020 20:34  Troponin I (High Sensitivity) Latest Ref Range: <18 ng/L 59 (H) 84 (H) 115 (HH)    Glaucoma -Continue Alphagan drops. -Hold Cosopt drops due to AKI.  Elevated bilirubin Likely due to dehydration. Follow-up level in a.m.  Chronic constipation -Currently no complaints of constipation -Although patient is not complaining of diarrhea, chart constantly stating patient has diarrhea and requires C. difficile labs.  WILL NOT RUN -DC all laxatives if patient complains of constipation restart    DVT prophylaxis: Heparin drip per pharmacy protocol Code Status: Full Family Communication:  Disposition Plan:  1.  Where the patient is from 2.  Anticipated d/c place. 3.  Barriers to d/c acute PE when stable per protocol   Consultants:    Procedures/Significant Events:  4/6 Echocardiogram;Left Ventricle:  LVEF=70 to 75%. The left ventricle has hyperdynamic function.-no regional wall motion abnormalities.-left ventricular internal cavity size was small. - Grade I diastolic dysfunction (impaired relaxation). Indeterminate filling pressures.  Right Ventricle: McConnell's sign is seen, consistent with pulmonary  embolism.  4/6 VQ scan reading pending    I have personally reviewed and interpreted all radiology studies and my findings are as above.  VENTILATOR SETTINGS:    Cultures 4/5 urine pending 4/5 blood LEFT AC NGTD   Antimicrobials: Anti-infectives (From admission, onward)   Start     Dose/Rate Stop   03/14/20 1000  cefTRIAXone (ROCEPHIN) 1 g in sodium chloride 0.9 % 100 mL IVPB     1 g 200 mL/hr over 30 Minutes     03/13/20 1645  cefTRIAXone (ROCEPHIN) 1 g in sodium chloride 0.9 % 100 mL IVPB     1 g 200 mL/hr over 30 Minutes 03/13/20 1728       Devices    LINES / TUBES:      Continuous Infusions: . sodium chloride 100 mL/hr at 03/14/20 0701  . cefTRIAXone (ROCEPHIN)  IV    . heparin 750 Units/hr (03/14/20 0908)     Objective: Vitals:   03/13/20 2227 03/13/20 2351 03/14/20 0216 03/14/20 0520  BP: (!) 81/41 92/60 (!) 98/55 108/71  Pulse: 77 79 74 78  Resp: 18  18 16   Temp: 97.6 F (36.4 C)  98.1 F (36.7 C) 97.8 F (36.6 C)  TempSrc:    Oral  SpO2: 100%  92% 98%  Weight:  Height:        Intake/Output Summary (Last 24 hours) at 03/14/2020 0913 Last data filed at 03/14/2020 0908 Gross per 24 hour  Intake 2022.47 ml  Output 300 ml  Net 1722.47 ml   Filed Weights   03/13/20 1505 03/13/20 1802  Weight: 55.6 kg 56.6 kg    Examination:  General: A/O x4, positive acute respiratory distress Eyes: negative scleral hemorrhage, negative anisocoria, negative icterus ENT: Negative Runny nose, negative gingival bleeding, Neck:  Negative scars, masses, torticollis, lymphadenopathy, JVD Lungs: Clear to auscultation bilaterally without wheezes or crackles Cardiovascular: Regular rate and rhythm without murmur gallop or rub normal S1 and S2 Abdomen: negative abdominal pain, nondistended, positive soft, bowel sounds, no rebound, no ascites, no appreciable mass Extremities: No significant cyanosis, clubbing, or edema bilateral lower extremities Skin: Negative  rashes, lesions, ulcers Psychiatric:  Negative depression, negative anxiety, negative fatigue, negative mania  Central nervous system:  Cranial nerves II through XII intact, tongue/uvula midline, all extremities muscle strength 5/5, sensation intact throughout, negative dysarthria, negative expressive aphasia, negative receptive aphasia.  .     Data Reviewed: Care during the described time interval was provided by me .  I have reviewed this patient's available data, including medical history, events of note, physical examination, and all test results as part of my evaluation.  CBC: Recent Labs  Lab 03/13/20 1417 03/14/20 0426  WBC 8.1 5.8  NEUTROABS 7.0  --   HGB 12.6 10.0*  HCT 39.5 30.7*  MCV 97.8 98.1  PLT 140* 119*   Basic Metabolic Panel: Recent Labs  Lab 03/13/20 1417 03/14/20 0426  NA 136 136  K 5.1 4.6  CL 96* 106  CO2 29 23  GLUCOSE 178* 134*  BUN 94* 73*  CREATININE 3.41* 1.93*  CALCIUM 9.9 8.7*   GFR: Estimated Creatinine Clearance: 18.3 mL/min (A) (by C-G formula based on SCr of 1.93 mg/dL (H)). Liver Function Tests: Recent Labs  Lab 03/13/20 1417 03/14/20 0426  AST 21 16  ALT 17 12  ALKPHOS 56 43  BILITOT 1.4* 0.7  PROT 8.3* 5.9*  ALBUMIN 3.2* 2.2*   No results for input(s): LIPASE, AMYLASE in the last 168 hours. No results for input(s): AMMONIA in the last 168 hours. Coagulation Profile: Recent Labs  Lab 03/13/20 1417  INR 1.0   Cardiac Enzymes: No results for input(s): CKTOTAL, CKMB, CKMBINDEX, TROPONINI in the last 168 hours. BNP (last 3 results) No results for input(s): PROBNP in the last 8760 hours. HbA1C: Recent Labs    03/14/20 0426  HGBA1C 7.7*   CBG: Recent Labs  Lab 03/13/20 2021 03/14/20 0732  GLUCAP 163* 124*   Lipid Profile: No results for input(s): CHOL, HDL, LDLCALC, TRIG, CHOLHDL, LDLDIRECT in the last 72 hours. Thyroid Function Tests: No results for input(s): TSH, T4TOTAL, FREET4, T3FREE, THYROIDAB in the last  72 hours. Anemia Panel: No results for input(s): VITAMINB12, FOLATE, FERRITIN, TIBC, IRON, RETICCTPCT in the last 72 hours. Sepsis Labs: Recent Labs  Lab 03/13/20 1417  LATICACIDVEN 1.7    Recent Results (from the past 240 hour(s))  Blood Culture (routine x 2)     Status: None (Preliminary result)   Collection Time: 03/13/20  2:15 PM   Specimen: BLOOD  Result Value Ref Range Status   Specimen Description   Final    BLOOD LEFT ANTECUBITAL Performed at [facility], [address] [place], [place] [address]    Special Requests   Final    BOTTLES DRAWN AEROBIC  AND ANAEROBIC Blood Culture adequate volume Performed at [facility], [address] [place], [place] [address]    Culture   Final    NO GROWTH < 12 HOURS Performed at [facility], [address], [place], [place] [address]    Report Status PENDING  Incomplete         Radiology Studies: CT Head Wo Contrast  Result Date: 03/13/2020 CLINICAL DATA:  Altered mental status EXAM: CT HEAD WITHOUT CONTRAST TECHNIQUE: Contiguous axial images were obtained from the base of the skull through the vertex without intravenous contrast. COMPARISON:  None. FINDINGS: Brain: There is atrophy and chronic small vessel disease changes. No acute intracranial abnormality. Specifically, no hemorrhage, hydrocephalus, mass lesion, acute infarction, or significant intracranial injury. Vascular: No hyperdense vessel or unexpected calcification. Skull: No acute calvarial abnormality. Sinuses/Orbits: Visualized paranasal sinuses and mastoids clear. Orbital soft tissues unremarkable. Other: None IMPRESSION: Atrophy, chronic microvascular disease. No acute intracranial abnormality. Electronically Signed   By: Charlett Nose M.D.   On: 03/13/2020 15:17   DG Chest Port 1 View  Result Date: 03/13/2020 CLINICAL DATA:  Syncope. EXAM: PORTABLE CHEST 1 VIEW COMPARISON:  February 11, 2020. FINDINGS: Stable cardiomediastinal  silhouette. No pneumothorax or pleural effusion is noted. Lungs are clear. Bony thorax is unremarkable. IMPRESSION: No acute cardiopulmonary abnormality seen. Electronically Signed   By: Lupita Raider M.D.   On: 03/13/2020 15:27        Scheduled Meds: . ascorbic acid  1,000 mg Oral Daily  . brimonidine  1 drop Both Eyes BID  . dronabinol  2.5 mg Oral BID AC  . insulin aspart  0-9 Units Subcutaneous TID WC  . linagliptin  5 mg Oral Daily  . magnesium oxide  400 mg Oral QHS  . polyethylene glycol  17 g Oral BID  . simvastatin  10 mg Oral QHS  . zinc sulfate  220 mg Oral Daily   Continuous Infusions: . sodium chloride 100 mL/hr at 03/14/20 0701  . cefTRIAXone (ROCEPHIN)  IV    . heparin 750 Units/hr (03/14/20 0908)     LOS: 1 day    Time spent:40 min    Ailani Governale, Roselind Messier, MD [facility] Pager [phone]  If 7PM-7AM, please contact night-coverage [email or web address] Password [facility] 03/14/2020, 9:13 AM

## 2020-03-15 DIAGNOSIS — R778 Other specified abnormalities of plasma proteins: Secondary | ICD-10-CM

## 2020-03-15 DIAGNOSIS — H409 Unspecified glaucoma: Secondary | ICD-10-CM

## 2020-03-15 DIAGNOSIS — E1121 Type 2 diabetes mellitus with diabetic nephropathy: Secondary | ICD-10-CM

## 2020-03-15 LAB — CBC
HCT: 29 % — ABNORMAL LOW (ref 36.0–46.0)
Hemoglobin: 9.3 g/dL — ABNORMAL LOW (ref 12.0–15.0)
MCH: 31.1 pg (ref 26.0–34.0)
MCHC: 32.1 g/dL (ref 30.0–36.0)
MCV: 97 fL (ref 80.0–100.0)
Platelets: 116 10*3/uL — ABNORMAL LOW (ref 150–400)
RBC: 2.99 MIL/uL — ABNORMAL LOW (ref 3.87–5.11)
RDW: 16.1 % — ABNORMAL HIGH (ref 11.5–15.5)
WBC: 4.9 10*3/uL (ref 4.0–10.5)
nRBC: 0 % (ref 0.0–0.2)

## 2020-03-15 LAB — COMPREHENSIVE METABOLIC PANEL
ALT: 11 U/L (ref 0–44)
AST: 19 U/L (ref 15–41)
Albumin: 2.3 g/dL — ABNORMAL LOW (ref 3.5–5.0)
Alkaline Phosphatase: 41 U/L (ref 38–126)
Anion gap: 5 (ref 5–15)
BUN: 52 mg/dL — ABNORMAL HIGH (ref 8–23)
CO2: 23 mmol/L (ref 22–32)
Calcium: 8.7 mg/dL — ABNORMAL LOW (ref 8.9–10.3)
Chloride: 112 mmol/L — ABNORMAL HIGH (ref 98–111)
Creatinine, Ser: 1.17 mg/dL — ABNORMAL HIGH (ref 0.44–1.00)
GFR calc Af Amer: 49 mL/min — ABNORMAL LOW (ref >60)
GFR calc non Af Amer: 42 mL/min — ABNORMAL LOW (ref >60)
Glucose, Bld: 123 mg/dL — ABNORMAL HIGH (ref 70–99)
Potassium: 4.4 mmol/L (ref 3.5–5.1)
Sodium: 140 mmol/L (ref 135–145)
Total Bilirubin: 0.8 mg/dL (ref 0.3–1.2)
Total Protein: 5.7 g/dL — ABNORMAL LOW (ref 6.5–8.1)

## 2020-03-15 LAB — SARS CORONAVIRUS 2 (TAT 6-24 HRS): SARS Coronavirus 2: NEGATIVE

## 2020-03-15 LAB — GLUCOSE, CAPILLARY
Glucose-Capillary: 109 mg/dL — ABNORMAL HIGH (ref 70–99)
Glucose-Capillary: 157 mg/dL — ABNORMAL HIGH (ref 70–99)
Glucose-Capillary: 120 mg/dL — ABNORMAL HIGH (ref 70–99)
Glucose-Capillary: 128 mg/dL — ABNORMAL HIGH (ref 70–99)

## 2020-03-15 LAB — HEPARIN LEVEL (UNFRACTIONATED)
Heparin Unfractionated: 0.66 IU/mL (ref 0.30–0.70)
Heparin Unfractionated: 0.43 IU/mL (ref 0.30–0.70)

## 2020-03-15 MED ORDER — ADULT MULTIVITAMIN W/MINERALS CH
1.0000 | ORAL_TABLET | Freq: Every day | ORAL | Status: DC
  Administered 2020-03-15 – 2020-03-17 (×3): 1 via ORAL
  Filled 2020-03-15 (×3): qty 1

## 2020-03-15 MED ORDER — DORZOLAMIDE HCL-TIMOLOL MAL 2-0.5 % OP SOLN
1.0000 [drp] | Freq: Two times a day (BID) | OPHTHALMIC | Status: DC
  Administered 2020-03-15 – 2020-03-17 (×4): 1 [drp] via OPHTHALMIC
  Filled 2020-03-15: qty 10

## 2020-03-15 MED ORDER — DILTIAZEM HCL ER COATED BEADS 180 MG PO CP24
180.0000 mg | ORAL_CAPSULE | Freq: Every day | ORAL | Status: DC
  Administered 2020-03-15 – 2020-03-17 (×3): 180 mg via ORAL
  Filled 2020-03-15 (×3): qty 1

## 2020-03-15 MED ORDER — SALINE SPRAY 0.65 % NA SOLN
1.0000 | NASAL | Status: DC | PRN
  Administered 2020-03-15: 12:00:00 1 via NASAL
  Filled 2020-03-15: qty 44

## 2020-03-15 MED ORDER — ENSURE ENLIVE PO LIQD
237.0000 mL | Freq: Two times a day (BID) | ORAL | Status: DC
  Administered 2020-03-15 – 2020-03-17 (×3): 237 mL via ORAL

## 2020-03-15 MED ORDER — APIXABAN 5 MG PO TABS
5.0000 mg | ORAL_TABLET | Freq: Two times a day (BID) | ORAL | Status: DC
  Administered 2020-03-15 – 2020-03-17 (×4): 5 mg via ORAL
  Filled 2020-03-15 (×4): qty 1

## 2020-03-15 MED ORDER — DILTIAZEM HCL ER BEADS 180 MG PO CP24: 180.0000 mg | ORAL_CAPSULE | Freq: Every day | ORAL | Status: DC

## 2020-03-15 NOTE — Progress Notes (Signed)
On call provider made aware of nose bleed this morning. Pt remain  Asymptomatic.  Heparin conts at 6ML/HR.

## 2020-03-15 NOTE — Progress Notes (Addendum)
ANTICOAGULATION CONSULT NOTE - Initial Consult  Pharmacy Consult for IV heparin Indication: pulmonary embolus  Allergies  Allergen Reactions  . Penicillins Hives and Itching    Did it involve swelling of the face/tongue/throat, SOB, or low BP? No Did it involve sudden or severe rash/hives, skin peeling, or any reaction on the inside of your mouth or nose? Yes Did you need to seek medical attention at a hospital or doctor's office? Unknown When did it last happen?Unknown (on MAR) If all above answers are "NO", may proceed with cephalosporin use.     Patient Measurements: Height: 5\' 5"  (165.1 cm) Weight: 56.6 kg (124 lb 12.5 oz) IBW/kg (Calculated) : 57 Heparin Dosing Weight: n/a. Use TBW 57 kg  Vital Signs: Temp: 97.7 F (36.5 C) (04/07 0525) Temp Source: Tympanic (04/07 0525) BP: 144/89 (04/07 0525) Pulse Rate: 105 (04/07 0525)  Labs: Estimated Creatinine Clearance: 30.3 mL/min (A) (by C-G formula based on SCr of 1.17 mg/dL (H)).   Medical History: Past Medical History:  Diagnosis Date  . Bowel obstruction (HCC)   . Diabetes mellitus without complication (HCC)   . Diverticulitis   . Glaucoma   . Hernia   . Hypertension   . Pulmonary emboli [facility])     Assessment: 86 yo female presented to ED with AMS found to have AKI. Pt prescribed rivaroxaban 20 mg PO daily with supper PTA for hx PE. On admission, anticoagulation transitioned to heparin drip given AKI. Baseline HL was undetectable, would expect it be falsely elevated due to DOAC. Questionable compliance?  Today, 03/15/20  CBC: Hgb (9.3) - low, trending down. Plt (116) - low, slightly decreased  Confirmatory HL = 0.43 remains therapeutic on heparin infusion of 600 units/hr  Confirmed with RN that heparin infusing at correct rate with no interruptions. RN reports pt having some off-and-on epistaxis, but MD aware. No other signs of bleeding.  Dosing/monitoring using HL only since baseline HL  undetectable  SCr 1.17, CrCl ~30 mL/min. Improving.  Goal of Therapy:  Heparin level 0.3-0.7 units/ml Monitor platelets by anticoagulation protocol: Yes   Plan:   Continue heparin infusion at current rate of 600 units/hr  Daily CBC, HL  Monitor for signs/symptoms of bleeding  Follow along for eventual transition back to oral anticoagulation  05/15/20, PharmD 03/15/2020,1:11 PM  Addendum: Per discussion with MD, will transition patient from heparin infusion to apixaban this evening for history of PE. Changing from Xarelto PTA to apixaban given renal function.   Initiate apixaban 5 mg PO BID at time of heparin discontinuation.    05/15/2020, PharmD 03/15/20 2:33 PM

## 2020-03-15 NOTE — TOC Progression Note (Signed)
Transition of Care [facility]) - Progression Note    Patient Details  Name: Kathryn Bradford MRN: [id number] Date of Birth: [birth year]  Transition of Care [facility]) CM/SW Contact  Geni Bers, RN Phone Number: 03/15/2020, 3:18 PM  Clinical Narrative:    Pt from [facility] will need PT/OT notes for [employer]. Order was sent to Acute Rehab Team. Will need Authorization from [facility] insurance.         Expected Discharge Plan and Services                                                 Social Determinants of Health (SDOH) Interventions    Readmission Risk Interventions No flowsheet data found.

## 2020-03-15 NOTE — Progress Notes (Addendum)
PROGRESS NOTE  Kathryn Bradford HFW:[id number] DOB: 15-Jan-1930 DOA: 03/13/2020 PCP: [facility]   LOS: 2 days   Brief narrative: As per HPI,  Kathryn Boatwrightis a 86 y.o.female with past medical history of type 2 diabetes, history of SBO, diverticulitis, aortic insufficiency, chronic diastolic CHF, PAD, HTN, HLD, PE, with Pulmonary infarct/DVT (on chronic anticoagulation, CKD (baseline Cr 1.44 07/18/2017) was brought to the emergency department via EMS from skilled nursing facility due to syncopal episode during bowel movement. She was unable to provide history. The facility reported that the patient had history of COVID almost month back.  She did have some episodes of hypoxia in the skilled nursing facility as well with decreased oral intake.  She was treated with oral ciprofloxacin for 3 days at the skilled nursing facility for UTI.  ED Course:Initial vital signs were temperature97.8 F, pulse 55, respiration 15, blood pressure 98/60 mmHg, 100% on nasal cannula at 3 LPM. Urinalysis had a turbid appearance with large hemoglobinuria, moderate leukocyte esterase, proteinuria 100 mg/dL. She had a more than 50 RBC and more than 50 WBC with rare bacteria on microscopic urine exam. Her CBC showed a white count is 8.1 with 87% neutrophils, following 12.6 g/dL and platelets 027. PT was 12.9, INR 1.0 and APTT less than 20. D-dimer was over 20. Lactic acid was normal. Troponin was 59 ng/L. CMP shows a sodium 136, potassium 5.1, chloride 96 and CO2 29 mmol/L. Glucose 178, BUN 94 and creatinine 3.41 mg/dL. Total protein is 8.3 and albumin 3.2 g/dL. Transaminases are normal. Total bilirubin was 1.4 mg/dL.Chest radiograph did not show any acute abnormality. CT head did not show any acute intracranial pathology. The patient received IV fluid bolus and 1 g of ceftriaxone and was admitted to the hospital.   Assessment/Plan:  Principal Problem:   AKI (acute kidney injury) (HCC) Active  Problems:   Type 2 diabetes mellitus (HCC)   Pulmonary emboli (HCC)   Hypertension   Glaucoma   Acute lower UTI   Elevated bilirubin   Elevated troponin   Chronic constipation   Syncope   Diabetes mellitus type 2, uncontrolled, with complications (HCC)   SBO (small bowel obstruction) (HCC)   Diverticulosis   Hx of diverticulitis of colon   Aortic insufficiency   Chronic diastolic CHF (congestive heart failure) (HCC)   PAD (peripheral artery disease) (HCC)   Essential hypertension   HLD (hyperlipidemia)   Hx of pulmonary embolus   Hx of pulmonary infarction   DVT (deep venous thrombosis) (HCC)   CKD (chronic kidney disease), stage III   Chronic anticoagulation   Syncope, likely vasovagal as per history.No significant arrhythmias noted.  Resume Cardizem.  2D echo with preserved LV function.  Acute kidney injury on CKD stage IIIb.  Baseline creatinine around 1.4.  Received IV fluid hydration.  ACE inhibitor's were kept on hold.  At this time, her renal function has improved to baseline.  Creatinine today at 1.1.  Continue to hold the lisinopril for 1 more day.  History of DVT in chronic pulmonary embolism on Xarelto at home.  Patient was started on heparin drip.  This will be changed to Eliquis at this time due to her renal dysfunction.  Spoke with the pharmacy about it.  VQ scan was performed which showed indeterminate results.  Unable to do CT angiogram due to acute kidney injury and chronic kidney disease.  Patient is not tachycardic or hypoxic.  Low probability for new pulmonary embolism.  Acute lower UTI.  Continue  with IV Rocephin.  Follow blood cultures and urine cultures.  Blood cultures negative so far.   Essential hypertension.  On Cardizem and lisinopril at home.  Will continue to hold lisinopril.  Restart Cardizem today.  Closely monitor blood pressure.    Diabetes mellitus type II.Last hemoglobin A1c of 7.7.  Continue sliding scale insulin.  Continue Tradjenta.  Diabetic  diet.  Closely monitor.  POC glucose of 157.  Elevated troponin likely secondary to demand ischemia.  No chest pain shortness of breath or EKG changes.  EKG showed normal sinus rhythm.  2D echocardiogram done 03/14/2020 showed left ventricular ejection fraction of 70 to 75% with LVH and McConnell sign.  Glaucoma.  Continue Alphagan drops.  Resume Cosopt drops.  Elevated bilirubin.  AST, ALT within normal limits.  Check CMP in a.m.  VTE Prophylaxis: Changed to Eliquis  Code Status: Full code  Family Communication: I had a prolonged discussion with the patient's daughter Ms. Laverne who is also the health power of attorney.Marland Kitchen  Updated her about the clinical condition of the patient and the plan for disposition.  Disposition Plan:  . Patient is from skilled nursing facility . Likely disposition to skilled nursing facility likely in 1 to 2 days . Barriers to discharge: PT OT evaluation, insurance authorization  Consultants:  None  Procedures:  None  Antibiotics:  . Rocephin IV 03/13/20>  Anti-infectives (From admission, onward)   Start     Dose/Rate Route Frequency Ordered Stop   03/14/20 1000  cefTRIAXone (ROCEPHIN) 1 g in sodium chloride 0.9 % 100 mL IVPB     1 g 200 mL/hr over 30 Minutes Intravenous Every 24 hours 03/13/20 1705     03/13/20 1645  cefTRIAXone (ROCEPHIN) 1 g in sodium chloride 0.9 % 100 mL IVPB     1 g 200 mL/hr over 30 Minutes Intravenous  Once 03/13/20 1635 03/13/20 1728       Subjective: Today, patient was seen and examined at bedside.    Denies chest pain, shortness of breath, fever or chills.  Complained of epistaxis yesterday and feels a little stuffed up today.  Objective: Vitals:   03/15/20 0525 03/15/20 1406  BP: (!) 144/89 126/76  Pulse: (!) 105 79  Resp: 18 12  Temp: 97.7 F (36.5 C) 97.6 F (36.4 C)  SpO2: 99% 98%    Intake/Output Summary (Last 24 hours) at 03/15/2020 1515 Last data filed at 03/15/2020 1406 Gross per 24 hour  Intake  1608.97 ml  Output --  Net 1608.97 ml   Filed Weights   03/13/20 1505 03/13/20 1802  Weight: 55.6 kg 56.6 kg   Body mass index is 20.76 kg/m.   Physical Exam: GENERAL: Patient is alert awake and communicative not in obvious distress.  Oriented.  Thinly built. HENT: No scleral pallor or icterus. Pupils equally reactive to light. Oral mucosa is moist NECK: is supple, no gross swelling noted. CHEST: Clear to auscultation. No crackles or wheezes.  Diminished breath sounds bilaterally. CVS: S1 and S2 heard, no murmur. Regular rate and rhythm.  ABDOMEN: Soft, non-tender, bowel sounds are present. EXTREMITIES: No edema.  Left lower leg with mild bruise. CNS: Cranial nerves are intact. No focal motor deficits. SKIN: warm and dry without rashes.  Data Review: I have personally reviewed the following laboratory data and studies,  CBC: Recent Labs  Lab 03/13/20 1417 03/14/20 0426 03/14/20 0940 03/15/20 0421  WBC 8.1 5.8 5.8 4.9  NEUTROABS 7.0  --  4.3  --  HGB 12.6 10.0* 11.2* 9.3*  HCT 39.5 30.7* 35.8* 29.0*  MCV 97.8 98.1 98.9 97.0  PLT 140* 119* 137* 116*   Basic Metabolic Panel: Recent Labs  Lab 03/13/20 1417 03/14/20 0426 03/14/20 0940 03/15/20 0421  NA 136 136 136 140  K 5.1 4.6 4.6 4.4  CL 96* 106 104 112*  CO2 GLUCOSE 178* 134* 157* 123*  BUN 94* 73* 69* 52*  CREATININE 3.41* 1.93* 1.71* 1.17*  CALCIUM 9.9 8.7* 8.9 8.7*  MG  --   --  3.3*  --   PHOS  --   --  3.3  --    Liver Function Tests: Recent Labs  Lab 03/13/20 1417 03/14/20 0426 03/14/20 0940 03/15/20 0421  AST ALT ALKPHOS 56 43 49 41  BILITOT 1.4* 0.7 0.7 0.8  PROT 8.3* 5.9* 6.6 5.7*  ALBUMIN 3.2* 2.2* 2.5* 2.3*   No results for input(s): LIPASE, AMYLASE in the last 168 hours. No results for input(s): AMMONIA in the last 168 hours. Cardiac Enzymes: No results for input(s): CKTOTAL, CKMB, CKMBINDEX, TROPONINI in the last 168 hours. BNP (last 3  results) No results for input(s): BNP in the last 8760 hours.  ProBNP (last 3 results) No results for input(s): PROBNP in the last 8760 hours.  CBG: Recent Labs  Lab 03/14/20 1304 03/14/20 1629 03/14/20 2045 03/15/20 0740 03/15/20 1213  GLUCAP 151* 101* 116* 109* 157*   Recent Results (from the past 240 hour(s))  Urine culture     Status: None (Preliminary result)   Collection Time: 03/13/20  1:40 PM   Specimen: In/Out Cath Urine  Result Value Ref Range Status   Specimen Description   Final    IN/OUT CATH URINE Performed at [facility], [address], [place], [place] [address]    Special Requests   Final    NONE Performed at [facility], [address], [place], [place] [address]    Culture   Final    CULTURE REINCUBATED FOR BETTER GROWTH Performed at [facility], [address] [place], [place] [address]    Report Status PENDING  Incomplete  Blood Culture (routine x 2)     Status: None (Preliminary result)   Collection Time: 03/13/20  2:15 PM   Specimen: BLOOD  Result Value Ref Range Status   Specimen Description   Final    BLOOD LEFT ANTECUBITAL Performed at [facility], [address], [place], [place] [address]    Special Requests   Final    BOTTLES DRAWN AEROBIC AND ANAEROBIC Blood Culture adequate volume Performed at [facility], [address], [place], [place] [address]    Culture   Final    NO GROWTH 2 DAYS Performed at [facility], [address], [place], [place] [address]    Report Status PENDING  Incomplete  Blood Culture (routine x 2)     Status: None (Preliminary result)   Collection Time: 03/13/20  2:20 PM   Specimen: BLOOD  Result Value Ref Range Status   Specimen Description   Final    BLOOD RIGHT ANTECUBITAL Performed at [facility], [address], [place], [place] [address]    Special Requests   Final    BOTTLES DRAWN  AEROBIC AND ANAEROBIC Blood Culture results may not be optimal due to an inadequate volume of blood received in  culture bottles Performed at [facility], [address], [place], [place] [address]    Culture   Final    NO GROWTH 2 DAYS Performed at [facility], [address] [place], [place] [address]    Report Status PENDING  Incomplete  SARS CORONAVIRUS 2 (TAT 6-24 HRS) Nasopharyngeal Nasopharyngeal Swab     Status: None   Collection Time: 03/14/20  6:31 PM   Specimen: Nasopharyngeal Swab  Result Value Ref Range Status   SARS Coronavirus 2 NEGATIVE NEGATIVE Final    Comment: (NOTE) SARS-CoV-2 target nucleic acids are NOT DETECTED. The SARS-CoV-2 RNA is generally detectable in upper and lower respiratory specimens during the acute phase of infection. Negative results do not preclude SARS-CoV-2 infection, do not rule out co-infections with other pathogens, and should not be used as the sole basis for treatment or other patient management decisions. Negative results must be combined with clinical observations, patient history, and epidemiological information. The expected result is Negative. Fact Sheet for Patients: HairSlick.no Fact Sheet for Healthcare Providers: quierodirigir.com This test is not yet approved or cleared by the [place] FDA and  has been authorized for detection and/or diagnosis of SARS-CoV-2 by FDA under an Emergency Use Authorization (EUA). This EUA will remain  in effect (meaning this test can be used) for the duration of the COVID-19 declaration under Section 56 4(b)(1) of the Act, 21 U.S.C. section 360bbb-3(b)(1), unless the authorization is terminated or revoked sooner. Performed at [facility], [address] [place], [place] [address]      Studies: NM Pulmonary Perfusion  Result Date: 03/14/2020 CLINICAL DATA:  Hypoxia. EXAM: NUCLEAR MEDICINE PERFUSION LUNG  SCAN TECHNIQUE: Perfusion images were obtained in multiple projections after intravenous injection of radiopharmaceutical. Ventilation scans intentionally deferred if perfusion scan and chest x-ray adequate for interpretation during COVID 19 epidemic. RADIOPHARMACEUTICALS:  1.6 mCi Tc-66m MAA IV COMPARISON:  Chest x-ray 03/13/2020. FINDINGS: Perfusion defect right upper lung possibly secondary to pulmonary infiltrate on chest x-ray. Mild left base subsegmental perfusion defects possibly related to atelectasis noted on chest x-ray. IMPRESSION: Perfusion defects matching chest x-ray abnormalities. This is an indeterminate scan for pulmonary embolus. Electronically Signed   By: Maisie Fus  Register   On: 03/14/2020 12:53   DG Chest Port 1 View  Result Date: 03/13/2020 CLINICAL DATA:  Syncope. EXAM: PORTABLE CHEST 1 VIEW COMPARISON:  February 11, 2020. FINDINGS: Stable cardiomediastinal silhouette. No pneumothorax or pleural effusion is noted. Lungs are clear. Bony thorax is unremarkable. IMPRESSION: No acute cardiopulmonary abnormality seen. Electronically Signed   By: Lupita Raider M.D.   On: 03/13/2020 15:27   ECHOCARDIOGRAM COMPLETE  Result Date: 03/14/2020    ECHOCARDIOGRAM REPORT   Patient Name:   GAVYN ZOSS Date of Exam: 03/14/2020 Medical Rec #:  [id number]       Height:       65.0 in Accession #:    [id number]      Weight:       124.8 lb Date of Birth:  03/26/1930        BSA:          1.619 m Patient Age:    89 years        BP:           108/71 mmHg Patient Gender: F               HR:           78 bpm. Exam  Location:  Inpatient Procedure: 2D Echo Indications:    pulmonary embolus 415.19  History:        Patient has no prior history of Echocardiogram examinations.                 Risk Factors:Hypertension and Diabetes. Pulmonary emboli.  Sonographer:    Celene SkeenVijay Shankar RDCS (AE) Referring Phys: [id number] DAVID MANUEL ORTIZ  Sonographer Comments: very limited mobility & positioning IMPRESSIONS  1. The left  ventricular chamber is small and appears "underfilled".. Left ventricular ejection fraction, by estimation, is 70 to 75%. The left ventricle has hyperdynamic function. The left ventricle has no regional wall motion abnormalities. There is mild concentric left ventricular hypertrophy. Left ventricular diastolic parameters are consistent with Grade I diastolic dysfunction (impaired relaxation).  2. McConnell's sign is seen, consistent with pulmonary embolism. Right ventricular systolic function is moderately reduced. The right ventricular size is mildly enlarged. Tricuspid regurgitation signal is inadequate for assessing PA pressure.  3. The mitral valve is normal in structure. No evidence of mitral valve regurgitation.  4. The aortic valve is normal in structure. Aortic valve regurgitation is trivial. Mild aortic valve sclerosis is present, with no evidence of aortic valve stenosis.  5. The inferior vena cava is normal in size with <50% respiratory variability, suggesting right atrial pressure of 8 mmHg. FINDINGS  Left Ventricle: The left ventricular chamber is small and appears "underfilled". Left ventricular ejection fraction, by estimation, is 70 to 75%. The left ventricle has hyperdynamic function. The left ventricle has no regional wall motion abnormalities.  The left ventricular internal cavity size was small. There is mild concentric left ventricular hypertrophy. Left ventricular diastolic parameters are consistent with Grade I diastolic dysfunction (impaired relaxation). Indeterminate filling pressures. Right Ventricle: McConnell's sign is seen, consistent with pulmonary embolism. The right ventricular size is mildly enlarged. No increase in right ventricular wall thickness. Right ventricular systolic function is moderately reduced. Tricuspid regurgitation signal is inadequate for assessing PA pressure. Left Atrium: Left atrial size was normal in size. Right Atrium: Right atrial size was normal in size.  Pericardium: There is no evidence of pericardial effusion. Mitral Valve: The mitral valve is normal in structure. No evidence of mitral valve regurgitation. Tricuspid Valve: The tricuspid valve is normal in structure. Tricuspid valve regurgitation is not demonstrated. Aortic Valve: The aortic valve is normal in structure. Aortic valve regurgitation is trivial. Mild aortic valve sclerosis is present, with no evidence of aortic valve stenosis. Pulmonic Valve: The pulmonic valve was not well visualized. Pulmonic valve regurgitation is not visualized. Aorta: The aortic root is normal in size and structure. Venous: The inferior vena cava is normal in size with less than 50% respiratory variability, suggesting right atrial pressure of 8 mmHg. IAS/Shunts: No atrial level shunt detected by color flow Doppler.  LEFT VENTRICLE PLAX 2D LVIDd:         2.20 cm  Diastology LVIDs:         1.60 cm  LV e' lateral:   4.90 cm/s LV PW:         1.40 cm  LV E/e' lateral: 11.4 LV IVS:        1.24 cm  LV e' medial:    6.20 cm/s LVOT diam:     1.90 cm  LV E/e' medial:  9.0 LV SV:         27 LV SV Index:   17 LVOT Area:     2.84 cm  RIGHT VENTRICLE RV S prime:  7.18 cm/s TAPSE (M-mode): 1.1 cm RIGHT ATRIUM          Index RA Area:     8.60 cm RA Volume:   16.40 ml 10.13 ml/m  AORTIC VALVE LVOT Vmax:   65.30 cm/s LVOT Vmean:  50.500 cm/s LVOT VTI:    0.096 m  AORTA Ao Root diam: 3.00 cm MITRAL VALVE MV Area (PHT): 2.42 cm    SHUNTS MV Decel Time: 314 msec    Systemic VTI:  0.10 m MV E velocity: 55.70 cm/s  Systemic Diam: 1.90 cm MV A velocity: 85.30 cm/s MV E/A ratio:  0.65 Mihai Croitoru MD Electronically signed by Thurmon Fair MD Signature Date/Time: 03/14/2020/10:38:57 AM    Final    VAS [place] LOWER EXTREMITY VENOUS (DVT)  Result Date: 03/15/2020  Lower Venous DVTStudy Indications: Pulmonary embolism.  Limitations: Poor ultrasound/tissue interface. Comparison Study: no prior Performing Technologist: Jeb Levering RDMS, RVT  Examination  Guidelines: A complete evaluation includes B-mode imaging, spectral Doppler, color Doppler, and power Doppler as needed of all accessible portions of each vessel. Bilateral testing is considered an integral part of a complete examination. Limited examinations for reoccurring indications may be performed as noted. The reflux portion of the exam is performed with the patient in reverse Trendelenburg.  +---------+---------------+---------+-----------+----------+--------------+ RIGHT    CompressibilityPhasicitySpontaneityPropertiesThrombus Aging +---------+---------------+---------+-----------+----------+--------------+ CFV      Full           Yes      Yes                                 +---------+---------------+---------+-----------+----------+--------------+ SFJ      Full                                                        +---------+---------------+---------+-----------+----------+--------------+ FV Prox  Full                                                        +---------+---------------+---------+-----------+----------+--------------+ FV Mid   Full                                                        +---------+---------------+---------+-----------+----------+--------------+ FV DistalFull                                                        +---------+---------------+---------+-----------+----------+--------------+ PFV      Full                                                        +---------+---------------+---------+-----------+----------+--------------+ POP      Full  Yes      Yes                                 +---------+---------------+---------+-----------+----------+--------------+ PTV      Full                                                        +---------+---------------+---------+-----------+----------+--------------+ PERO     Full                                                         +---------+---------------+---------+-----------+----------+--------------+   +---------+---------------+---------+-----------+----------+--------------+ LEFT     CompressibilityPhasicitySpontaneityPropertiesThrombus Aging +---------+---------------+---------+-----------+----------+--------------+ CFV      Full           Yes      Yes                                 +---------+---------------+---------+-----------+----------+--------------+ SFJ      Full                                                        +---------+---------------+---------+-----------+----------+--------------+ FV Prox  Full                                                        +---------+---------------+---------+-----------+----------+--------------+ FV Mid   Full                                                        +---------+---------------+---------+-----------+----------+--------------+ FV DistalFull                                                        +---------+---------------+---------+-----------+----------+--------------+ PFV      Full                                                        +---------+---------------+---------+-----------+----------+--------------+ POP      Full           Yes      Yes                                 +---------+---------------+---------+-----------+----------+--------------+ PTV  Full                                                        +---------+---------------+---------+-----------+----------+--------------+ PERO     Full                                                        +---------+---------------+---------+-----------+----------+--------------+     Summary: RIGHT: - There is no evidence of deep vein thrombosis in the lower extremity.  - No cystic structure found in the popliteal fossa.  LEFT: - There is no evidence of deep vein thrombosis in the lower extremity.  - No cystic structure found in the popliteal fossa.   *See table(s) above for measurements and observations. Electronically signed by Deitra Mayo MD on 03/15/2020 at 8:07:38 AM.    Final       Flora Lipps, MD  [facility] 03/15/2020

## 2020-03-15 NOTE — Progress Notes (Signed)
Initial Nutrition Assessment  **RD working remotely**  DOCUMENTATION CODES:   Not applicable  INTERVENTION:  Ensure Enlive po BID, each supplement provides 350 kcal and 20 grams of protein  MVI daily   NUTRITION DIAGNOSIS:   Inadequate oral intake related to decreased appetite as evidenced by other (comment)(per staff report at SNF).   GOAL:   Patient will meet greater than or equal to 90% of their needs   MONITOR:   PO intake, Supplement acceptance, Weight trends, Labs  REASON FOR ASSESSMENT:   Malnutrition Screening Tool    ASSESSMENT:   Pt with a PMH significant for T2DM, SBO, Diverticulitis, hernia, aortic insufficiency, glaucoma, CHF, PAD, HTN, HLD, PE, and CKD,and Covid infection 1 month ago presented to the ED from SNF due to syncopal episode. Pt admitted with AKI.  Unable to reach pt via phone.   Per H&P, the staff at the pt's nursing facility reported pt has been having increased oxygen requirements with hypoxic episodes with O2 saturations in the 80s. Staff also reported pt had decreased po intake over the last week.   PO Intake: 25-90% x 2 recorded meals  Current weight: 56.6 kg (124.78 lbs). Per Care Everywhere, pt weighed 61.7 kg (136 lb) on 02/14/20. This indicates an 8.25% wt loss x1 month, which is significant for time frame. Malnutrition is suspected; however, unable to diagnose at this time without detailed diet history and/or nutrition-focused physical exam.   UOP: x24 hours I/O: +3,331.32ml since admit  Medications reviewed and include: Vitamin C, Marinol, ss Novolog, tradjenta, mag-ox, zinc sulfate  Labs reviewed: Cr 1.17, CBGs 101-151  NUTRITION - FOCUSED PHYSICAL EXAM:  RD unable to perform at this time.   Diet Order:   Diet Order            Diet heart healthy/carb modified Room service appropriate? Yes; Fluid consistency: Thin  Diet effective now              EDUCATION NEEDS:   No education needs have been identified at  this time  Skin:  Skin Assessment: Reviewed RN Assessment  Last BM:  03/14/20  Height:   Ht Readings from Last 1 Encounters:  03/13/20 5\' 5"  (1.651 m)    Weight:   Wt Readings from Last 1 Encounters:  03/13/20 56.6 kg    BMI:  Body mass index is 20.76 kg/m.  Estimated Nutritional Needs:   Kcal:  1300-1500  Protein:  65-75 grams  Fluid:  >/= 1.3 L/d   05/13/20, MS, RD, LDN RD pager number and weekend/on-call pager number located in Amion.

## 2020-03-15 NOTE — Progress Notes (Signed)
ANTICOAGULATION CONSULT NOTE - Initial Consult  Pharmacy Consult for Heparin Indication: pulmonary embolus  Allergies  Allergen Reactions  . Penicillins Hives and Itching    Did it involve swelling of the face/tongue/throat, SOB, or low BP? No Did it involve sudden or severe rash/hives, skin peeling, or any reaction on the inside of your mouth or nose? Yes Did you need to seek medical attention at a hospital or doctor's office? Unknown When did it last happen?Unknown (on MAR) If all above answers are "NO", may proceed with cephalosporin use.     Patient Measurements: Height: 5\' 5"  (165.1 cm) Weight: 56.6 kg (124 lb 12.5 oz) IBW/kg (Calculated) : 57 Heparin Dosing Weight:   Vital Signs: Temp: 97.5 F (36.4 C) (04/06 2051) Temp Source: Oral (04/06 2051) BP: 105/83 (04/06 2051) Pulse Rate: 96 (04/06 2051)  Labs: Recent Labs    03/13/20 1417 03/13/20 1417 03/13/20 1655 03/13/20 1905 03/13/20 2034 03/14/20 0426 03/14/20 0426 03/14/20 0940 03/14/20 1651 03/15/20 0421  HGB 12.6   < >  --   --   --  10.0*   < > 11.2*  --  9.3*  HCT 39.5   < >  --   --   --  30.7*  --  35.8*  --  29.0*  PLT 140*   < >  --   --   --  119*  --  137*  --  116*  APTT <20*  --   --   --   --  187*  --   --   --   --   LABPROT 12.9  --   --   --   --   --   --   --   --   --   INR 1.0  --   --   --   --   --   --   --   --   --   HEPARINUNFRC  --    < >  --    < >  --  0.92*  --   --  0.87* 0.66  CREATININE 3.41*   < >  --   --   --  1.93*  --  1.71*  --  1.17*  TROPONINIHS 59*  --  84*  --  115*  --   --   --   --   --    < > = values in this interval not displayed.    Estimated Creatinine Clearance: 30.3 mL/min (A) (by C-G formula based on SCr of 1.17 mg/dL (H)).   Medical History: Past Medical History:  Diagnosis Date  . Bowel obstruction (HCC)   . Diabetes mellitus without complication (HCC)   . Diverticulitis   . Glaucoma   . Hernia   . Hypertension   . Pulmonary emboli  (HCC)     Medications:  Infusions:  . sodium chloride 100 mL/hr at 03/15/20 0404  . cefTRIAXone (ROCEPHIN)  IV 1 g (03/14/20 0918)  . heparin 600 Units/hr (03/14/20 1835)    Assessment: Patient with heparin level at goal.  No heparin issues noted.  Goal of Therapy:  Heparin level 0.3-0.7 units/ml Monitor platelets by anticoagulation protocol: Yes   Plan:  Continue heparin drip at current rate Recheck level at [address] [place] 03/15/2020,5:16 AM

## 2020-03-15 NOTE — NC FL2 (Signed)
[place] LEVEL OF CARE SCREENING TOOL     IDENTIFICATION  Patient Name: Kathryn Bradford Birthdate: 11-Sep-1930 Sex: female Admission Date (Current Location): 03/13/2020  [facility] and [place] Number:  Herbalist and Address:  [facility],  [place] [address], [place]      Provider Number: [id number]  Attending Physician Name and Address:  Flora Lipps, MD  Relative Name and Phone Number:  Reed Pandy Daughter [phone]    Current Level of Care: Hospital Recommended Level of Care: [place] Prior Approval Number:    Date Approved/Denied:   PASRR Number: [id number]  Discharge Plan: SNF    Current Diagnoses: Patient Active Problem List   Diagnosis Date Noted  . Diabetes mellitus type 2, uncontrolled, with complications ([place]) 09/32/3557  . SBO (small bowel obstruction) ([place]) 03/14/2020  . Diverticulosis 03/14/2020  . Hx of diverticulitis of colon 03/14/2020  . Aortic insufficiency 03/14/2020  . Chronic diastolic CHF (congestive heart failure) ([place]) 03/14/2020  . PAD (peripheral artery disease) ([place]) 03/14/2020  . Essential hypertension 03/14/2020  . HLD (hyperlipidemia) 03/14/2020  . Hx of pulmonary embolus 03/14/2020  . Hx of pulmonary infarction 03/14/2020  . DVT (deep venous thrombosis) ([place]) 03/14/2020  . CKD (chronic kidney disease), stage III 03/14/2020  . Chronic anticoagulation 03/14/2020  . AKI (acute kidney injury) ([place]) 03/13/2020  . Type 2 diabetes mellitus ([place]) 03/13/2020  . Pulmonary emboli ([place])   . Hypertension   . Glaucoma   . Acute lower UTI   . Elevated bilirubin   . Elevated troponin   . Chronic constipation   . Syncope   . Incisional hernia with gangrene and obstruction 10/05/2013  . Acute posthemorrhagic anemia 10/05/2013  . Type II or unspecified type diabetes mellitus without mention of complication, not stated as uncontrolled 10/05/2013  . Phlebitis and thrombophlebitis of  other deep vessels of lower extremities 10/05/2013  . Other pulmonary embolism and infarction 10/05/2013    Orientation RESPIRATION BLADDER Height & Weight     Self, Place  O2 Continent Weight: 56.6 kg Height:  5\' 5"  (165.1 cm)  BEHAVIORAL SYMPTOMS/MOOD NEUROLOGICAL BOWEL NUTRITION STATUS      Continent Diet(Dysphagia 1)  AMBULATORY STATUS COMMUNICATION OF NEEDS Skin   Limited Assist Verbally Normal                       Personal Care Assistance Level of Assistance  Bathing, Dressing Bathing Assistance: Limited assistance   Dressing Assistance: Limited assistance     Functional Limitations Info  Sight Sight Info: Impaired        SPECIAL CARE FACTORS FREQUENCY  PT (By licensed PT), OT (By licensed OT)     PT Frequency: 5 x weekly OT Frequency: 5 x weekly            Contractures      Additional Factors Info  Code Status, Allergies Code Status Info: Full Allergies Info: Penicillins           Current Medications (03/15/2020):  This is the current hospital active medication list Current Facility-Administered Medications  Medication Dose Route Frequency Provider Last Rate Last Admin  . 0.9 %  sodium chloride infusion   Intravenous Continuous Reubin Milan, MD 100 mL/hr at 03/15/20 0404 New Bag at 03/15/20 0404  . acetaminophen (TYLENOL) tablet 650 mg  650 mg Oral Q6H PRN Reubin Milan, MD       Or  . acetaminophen (TYLENOL) suppository 650  mg  650 mg Rectal Q6H PRN Bobette Mo, MD      . apixaban Everlene Balls) tablet 5 mg  5 mg Oral BID Pokhrel, Laxman, MD      . ascorbic acid (VITAMIN C) tablet 1,000 mg  1,000 mg Oral Daily Bobette Mo, MD   1,000 mg at 03/15/20 0910  . brimonidine (ALPHAGAN) 0.15 % ophthalmic solution 1 drop  1 drop Both Eyes BID Bobette Mo, MD   1 drop at 03/15/20 0910  . cefTRIAXone (ROCEPHIN) 1 g in sodium chloride 0.9 % 100 mL IVPB  1 g Intravenous Q24H Bobette Mo, MD 200 mL/hr at 03/15/20 0913 1  g at 03/15/20 0913  . dronabinol (MARINOL) capsule 2.5 mg  2.5 mg Oral BID AC Bobette Mo, MD   2.5 mg at 03/15/20 1210  . feeding supplement (ENSURE ENLIVE) (ENSURE ENLIVE) liquid 237 mL  237 mL Oral BID BM Pokhrel, Laxman, MD   237 mL at 03/15/20 1047  . heparin ADULT infusion 100 units/mL (25000 units/27mL sodium chloride 0.45%)  600 Units/hr Intravenous Continuous Pokhrel, Laxman, MD 6 mL/hr at 03/15/20 0720 600 Units/hr at 03/15/20 0720  . insulin aspart (novoLOG) injection 0-9 Units  0-9 Units Subcutaneous TID WC Bobette Mo, MD   2 Units at 03/15/20 1224  . linagliptin (TRADJENTA) tablet 5 mg  5 mg Oral Daily Bobette Mo, MD   5 mg at 03/15/20 0910  . magnesium oxide (MAG-OX) tablet 400 mg  400 mg Oral QHS Bobette Mo, MD   400 mg at 03/14/20 2145  . multivitamin with minerals tablet 1 tablet  1 tablet Oral Daily Pokhrel, Laxman, MD   1 tablet at 03/15/20 1210  . ondansetron (ZOFRAN) tablet 4 mg  4 mg Oral Q6H PRN Bobette Mo, MD       Or  . ondansetron [facility]) injection 4 mg  4 mg Intravenous Q6H PRN Bobette Mo, MD      . simvastatin (ZOCOR) tablet 10 mg  10 mg Oral QHS Bobette Mo, MD   10 mg at 03/14/20 2145  . sodium chloride (OCEAN) 0.65 % nasal spray 1 spray  1 spray Each Nare PRN Pokhrel, Laxman, MD   1 spray at 03/15/20 1224  . zinc sulfate capsule 220 mg  220 mg Oral Daily Bobette Mo, MD   220 mg at 03/15/20 4193     Discharge Medications: Please see discharge summary for a list of discharge medications.  Relevant Imaging Results:  Relevant Lab Results:   Additional Information [id number]  Geni Bers, RN

## 2020-03-15 NOTE — Plan of Care (Signed)
Patient resting in bed, updated family per patient request and asked Dr Rebekah [place] Pokhrel to call and update family as well. Will continue to monitor.  Problem: Activity: Goal: Risk for activity intolerance will decrease Outcome: Progressing   Problem: Safety: Goal: Ability to remain free from injury will improve Outcome: Progressing   Problem: Education: Goal: Knowledge of General Education information will improve Description: Including pain rating scale, medication(s)/side effects and non-pharmacologic comfort measures Outcome: Progressing   Problem: Health Behavior/Discharge Planning: Goal: Ability to manage health-related needs will improve Outcome: Progressing   Problem: Clinical Measurements: Goal: Ability to maintain clinical measurements within normal limits will improve Outcome: Progressing Goal: Will remain free from infection Outcome: Progressing Goal: Diagnostic test results will improve Outcome: Progressing Goal: Respiratory complications will improve Outcome: Progressing Goal: Cardiovascular complication will be avoided Outcome: Progressing   Problem: Activity: Goal: Risk for activity intolerance will decrease Outcome: Progressing   Problem: Nutrition: Goal: Adequate nutrition will be maintained Outcome: Progressing   Problem: Coping: Goal: Level of anxiety will decrease Outcome: Progressing   Problem: Elimination: Goal: Will not experience complications related to bowel motility Outcome: Progressing Goal: Will not experience complications related to urinary retention Outcome: Progressing   Problem: Pain Managment: Goal: General experience of comfort will improve Outcome: Progressing   Problem: Safety: Goal: Ability to remain free from injury will improve Outcome: Progressing   Problem: Skin Integrity: Goal: Risk for impaired skin integrity will decrease Outcome: Progressing   Problem: Education: Goal: Knowledge of disease and its progression  will improve Outcome: Progressing   Problem: Fluid Volume: Goal: Compliance with measures to maintain balanced fluid volume will improve Outcome: Progressing   Problem: Nutritional: Goal: Ability to make healthy dietary choices will improve Outcome: Progressing   Problem: Clinical Measurements: Goal: Complications related to the disease process, condition or treatment will be avoided or minimized Outcome: Progressing

## 2020-03-16 LAB — COMPREHENSIVE METABOLIC PANEL
ALT: 14 U/L (ref 0–44)
AST: 22 U/L (ref 15–41)
Albumin: 2.3 g/dL — ABNORMAL LOW (ref 3.5–5.0)
Alkaline Phosphatase: 44 U/L (ref 38–126)
Anion gap: 7 (ref 5–15)
BUN: 28 mg/dL — ABNORMAL HIGH (ref 8–23)
CO2: 22 mmol/L (ref 22–32)
Calcium: 9.3 mg/dL (ref 8.9–10.3)
Chloride: 112 mmol/L — ABNORMAL HIGH (ref 98–111)
Creatinine, Ser: 0.86 mg/dL (ref 0.44–1.00)
GFR calc Af Amer: >60 mL/min (ref >60)
GFR calc non Af Amer: >60 mL/min (ref >60)
Glucose, Bld: 127 mg/dL — ABNORMAL HIGH (ref 70–99)
Potassium: 4.6 mmol/L (ref 3.5–5.1)
Sodium: 141 mmol/L (ref 135–145)
Total Bilirubin: 0.4 mg/dL (ref 0.3–1.2)
Total Protein: 6 g/dL — ABNORMAL LOW (ref 6.5–8.1)

## 2020-03-16 LAB — URINE CULTURE

## 2020-03-16 LAB — CBC
HCT: 31.6 % — ABNORMAL LOW (ref 36.0–46.0)
Hemoglobin: 10 g/dL — ABNORMAL LOW (ref 12.0–15.0)
MCH: 31.1 pg (ref 26.0–34.0)
MCHC: 31.6 g/dL (ref 30.0–36.0)
MCV: 98.1 fL (ref 80.0–100.0)
Platelets: 127 10*3/uL — ABNORMAL LOW (ref 150–400)
RBC: 3.22 MIL/uL — ABNORMAL LOW (ref 3.87–5.11)
RDW: 16.7 % — ABNORMAL HIGH (ref 11.5–15.5)
WBC: 5.9 10*3/uL (ref 4.0–10.5)
nRBC: 0 % (ref 0.0–0.2)

## 2020-03-16 LAB — GLUCOSE, CAPILLARY
Glucose-Capillary: 98 mg/dL (ref 70–99)
Glucose-Capillary: 162 mg/dL — ABNORMAL HIGH (ref 70–99)
Glucose-Capillary: 97 mg/dL (ref 70–99)

## 2020-03-16 MED ORDER — SODIUM CHLORIDE 0.9% FLUSH
10.0000 mL | INTRAVENOUS | Status: DC | PRN
  Administered 2020-03-17: 10 mL

## 2020-03-16 MED ORDER — LISINOPRIL 5 MG PO TABS
2.5000 mg | ORAL_TABLET | Freq: Every day | ORAL | Status: DC
  Administered 2020-03-16 – 2020-03-17 (×2): 2.5 mg via ORAL
  Filled 2020-03-16: qty 1

## 2020-03-16 MED ORDER — LIP MEDEX EX OINT: TOPICAL_OINTMENT | CUTANEOUS | Status: DC | PRN

## 2020-03-16 NOTE — Evaluation (Signed)
Physical Therapy Evaluation Patient Details Name: Jamaris Biernat MRN: [id number] DOB: 07-06-1930 Today's Date: 03/16/2020   History of Present Illness  Keelynn Furgerson is a 86 y.o. female with past medical history of type 2 diabetes, history of SBO, diverticulitis, aortic insufficiency, chronic diastolic CHF, PAD, HTN, HLD, PE, with Pulmonary infarct/DVT (on chronic anticoagulation, CKD (baseline Cr 1.44 07/18/2017) was brought to the emergency department via EMS from skilled nursing facility due to syncopal episode during bowel movement.  She was unable to provide history.  The facility reported that the patient had history of COVID almost month back.  She did have some episodes of hypoxia in the skilled nursing facility as well with decreased oral intake.  She was treated with oral ciprofloxacin for 3 days at the skilled nursing facility for UTI.    Clinical Impression  Cecilee Rosner is 86 y.o. female admitted from SNF with above HPI and diagnosis. Patient is currently limited by functional impairments below (see PT problem list). She requires min-mod assist for functional transfers and gait to prevent LOB. Patient will benefit from continued skilled PT interventions to address impairments and progress independence with mobility, recommending return to SNF to continue with SNF level therapy. Acute PT will follow and progress as able.     Follow Up Recommendations SNF    Equipment Recommendations  None recommended by PT(TBD at facility)    Recommendations for Other Services       Precautions / Restrictions Precautions Precautions: Fall Restrictions Weight Bearing Restrictions: No      Mobility  Bed Mobility Overal bed mobility: Needs Assistance Bed Mobility: Supine to Sit;Sit to Supine     Supine to sit: Min assist;HOB elevated Sit to supine: Min assist;HOB elevated   General bed mobility comments: cues for pivoting to EOB, assist to raise trunk fully upright and to scoot to  EOB  Transfers Overall transfer level: Needs assistance Equipment used: Rolling walker (2 wheeled) Transfers: Sit to/from Stand Sit to Stand: Mod assist;Min assist Stand pivot transfers: Mod assist       General transfer comment: cues required for safe technique with RW, pt using bil UE's on walker to initiate power up.   Ambulation/Gait Ambulation/Gait assistance: Mod assist Gait Distance (Feet): 16 Feet Assistive device: Rolling walker (2 wheeled) Gait Pattern/deviations: Step-to pattern;Decreased stride length;Shuffle;Trunk flexed;Narrow base of support Gait velocity: slow Gait velocity interpretation: <1.31 ft/sec, indicative of household ambulator General Gait Details: pt very unsteady requirign min-mod assist to [employer] balance with gait. verbal cues for safe proximity to RW and assist required to position walker. Cues for step pattern and posture throughout. pt c/o bil knee pain after ~8' of gait and turned to sit on [facility] for rest before ambulating back to bed.  Stairs       Wheelchair Mobility    Modified Rankin (Stroke Patients Only)       Balance Overall balance assessment: Needs assistance Sitting-balance support: Feet supported Sitting balance-Leahy Scale: Good     Standing balance support: During functional activity;Bilateral upper extremity supported Standing balance-Leahy Scale: Poor            Pertinent Vitals/Pain Pain Assessment: No/denies pain    Home Living Family/patient expects to be discharged to:: Skilled nursing facility Living Arrangements: Alone   Type of Home: [place]: One level Home Equipment: Walker - 2 wheels;Wheelchair - manual;Bedside commode Additional Comments: pt admitted from SNF but confused at [place] stating she came in from home.  Prior Function Level of Independence: Needs assistance   Gait / Transfers Assistance Needed: pt uses RW for short bouts of gait. has been at facility requiring assist  from staff to mobilize.           Hand Dominance        Extremity/Trunk Assessment   Upper Extremity Assessment Upper Extremity Assessment: Generalized weakness;Defer to OT evaluation    Lower Extremity Assessment Lower Extremity Assessment: Generalized weakness    Cervical / Trunk Assessment Cervical / Trunk Assessment: Kyphotic  Communication   Communication: HOH  Cognition Arousal/Alertness: Awake/alert Behavior During Therapy: WFL for tasks assessed/performed Overall Cognitive Status: No family/caregiver present to determine baseline cognitive functioning        General Comments: Pt unable to name hospital, unable to state date and uncertains on how many days she has been in hospital. Pt does not understand why she is in the hospital and required re-orientation.      General Comments      Exercises     Assessment/Plan    PT Assessment Patient needs continued PT services  PT Problem List Decreased strength;Decreased activity tolerance;Decreased balance;Decreased mobility;Decreased cognition;Decreased knowledge of use of DME;Decreased safety awareness;Decreased knowledge of precautions       PT Treatment Interventions DME instruction;Gait training;Stair training;Functional mobility training;Therapeutic activities;Therapeutic exercise;Balance training;Patient/family education    PT Goals (Current goals can be found in the Care Plan section)  Acute Rehab PT Goals Patient Stated Goal: eventually back home PT Goal Formulation: With patient Time For Goal Achievement: 03/30/20 Potential to Achieve Goals: Fair    Frequency Min 2X/week    AM-PAC PT "6 Clicks" Mobility  Outcome Measure Help needed turning from your back to your side while in a flat bed without using bedrails?: A Little Help needed moving from lying on your back to sitting on the side of a flat bed without using bedrails?: A Little Help needed moving to and from a bed to a chair (including a  wheelchair)?: A Lot Help needed standing up from a chair using your arms (e.g., wheelchair or bedside chair)?: A Lot Help needed to walk in hospital room?: A Lot Help needed climbing 3-5 steps with a railing? : A Lot 6 Click Score: 14    End of Session Equipment Utilized During Treatment: Gait belt Activity Tolerance: Patient tolerated treatment well Patient left: in bed;with call bell/phone within reach;with bed alarm set Nurse Communication: Mobility status PT Visit Diagnosis: Unsteadiness on feet (R26.81);Muscle weakness (generalized) (M62.81);Difficulty in walking, not elsewhere classified (R26.2)    Time: 4431-5400 PT Time Calculation (min) (ACUTE ONLY): 30 min   Charges:   PT Evaluation $PT Eval Moderate Complexity: 1 Mod PT Treatments $Gait Training: 8-22 mins       Wynn Maudlin, DPT Physical Therapist with [facility] [phone]  03/16/2020 1:37 PM

## 2020-03-16 NOTE — TOC Progression Note (Addendum)
Transition of Care [facility]) - Progression Note    Patient Details  Name: Kathryn Bradford MRN: [id number] Date of Birth: 08/20/1930  [facility] CM/SW Contact  Darleene Cleaver, [place] Phone Number: 03/16/2020, 3:04 PM  Clinical Narrative:      CSW spoke to patient's daughter to discuss SNF placement situation.  CSW informed her that CSW was waiting for clinical notes to be sent to the insurance company for insurance authorization.  Patient's daughter plans to have patient return back to [facility] to continue with her therapy.  CSW faxed required clinical information to insurance company, insurance authorization has been started reference number [employer] ref [id number].  3:45pm  CSW attempted to call patients's daughter at her home phone number to let her know that insurance authorization is still pending, had to leave message on voicemail. for her mom      Expected Discharge Plan and Services   Patient plans to go to [facility].                                               Social Determinants of Health (SDOH) Interventions    Readmission Risk Interventions No flowsheet data found.

## 2020-03-16 NOTE — Care Management Important Message (Signed)
Important Message  Patient Details IM Letter given to Windell Moulding SW Case Manager to pressent to the Patient Name: Kathryn Bradford MRN: [id number] Date of Birth: [birth year]   Medicare Important Message Given:  Yes     Caren Macadam 03/16/2020, 11:10 AM

## 2020-03-16 NOTE — Evaluation (Signed)
Occupational Therapy Evaluation Patient Details Name: Kathryn Bradford MRN: [id number] DOB: 16-Jan-1930 Today's Date: 03/16/2020    History of Present Illness Kathryn Bradford is a 86 y.o. female with past medical history of type 2 diabetes, history of SBO, diverticulitis, aortic insufficiency, chronic diastolic CHF, PAD, HTN, HLD, PE, with Pulmonary infarct/DVT (on chronic anticoagulation, CKD (baseline Cr 1.44 07/18/2017) was brought to the emergency department via EMS from skilled nursing facility due to syncopal episode during bowel movement.  She was unable to provide history.  The facility reported that the patient had history of COVID almost month back.  She did have some episodes of hypoxia in the skilled nursing facility as well with decreased oral intake.  She was treated with oral ciprofloxacin for 3 days at the skilled nursing facility for UTI.   Clinical Impression   Pt admitted with UTI. Pt currently with functional limitations due to the deficits listed below (see OT Problem List).  Pt will benefit from skilled OT to increase their safety and independence with ADL and functional mobility for ADL to facilitate discharge to venue listed below.      Follow Up Recommendations  SNF    Equipment Recommendations  None recommended by OT       Precautions / Restrictions Precautions Precautions: (P) Fall Restrictions Weight Bearing Restrictions: (P) No      Mobility Bed Mobility Overal bed mobility: Needs Assistance Bed Mobility: Supine to Sit     Supine to sit: Min assist        Transfers Overall transfer level: Needs assistance Equipment used: 1 person hand held assist Transfers: Stand Pivot Transfers   Stand pivot transfers: Mod assist                ADL either performed or assessed with clinical judgement   ADL Overall ADL's : Needs assistance/impaired Eating/Feeding: Set up;Sitting   Grooming: Wash/dry Psychiatric nurse: Moderate assistance;BSC;Stand-pivot;Cueing for sequencing;Cueing for safety   Toileting- Clothing Manipulation and Hygiene: Moderate assistance;Sit to/from stand;Cueing for compensatory techniques;With caregiver independent assisting;Cueing for sequencing;Sitting/lateral lean               Vision Patient Visual Report: No change from baseline              Pertinent Vitals/Pain Pain Assessment: No/denies pain        Extremity/Trunk Assessment Upper Extremity Assessment Upper Extremity Assessment: Generalized weakness              Cognition     Overall Cognitive Status: Within Functional Limits for tasks assessed                                                Home Living Family/patient expects to be discharged to:: (P) Skilled nursing facility Living Arrangements: (P) Alone   Type of Home: (P) Apartment       Home Layout: (P) One level               Home Equipment: (P) Walker - 2 wheels;Wheelchair - manual;Bedside commode                   OT Problem List: Decreased strength;Decreased activity tolerance;Impaired balance (sitting and/or standing)      OT Treatment/Interventions: Self-care/ADL training;DME  and/or AE instruction;Patient/family education    OT Goals(Current goals can be found in the care plan section) Acute Rehab OT Goals Patient Stated Goal: eventually back home OT Goal Formulation: With patient  OT Frequency:      AM-PAC OT "6 Clicks" Daily Activity     Outcome Measure Help from another person eating meals?: A Little Help from another person taking care of personal grooming?: A Little Help from another person toileting, which includes using toliet, bedpan, or urinal?: A Lot Help from another person bathing (including washing, rinsing, drying)?: A Lot Help from another person to put on and taking off regular upper body clothing?: A Little Help from another person to put on and taking off regular  lower body clothing?: A Lot 6 Click Score: 15   End of Session Equipment Utilized During Treatment: Gait belt Nurse Communication: Mobility status  Activity Tolerance: Patient tolerated treatment well Patient left: Other (comment)(on BSC with CNA present)  OT Visit Diagnosis: Unsteadiness on feet (R26.81);Other abnormalities of gait and mobility (R26.89);Muscle weakness (generalized) (M62.81);History of falling (Z91.81)                Time: 1035-1050 OT Time Calculation (min): 15 min Charges:  OT General Charges $OT Visit: 1 Visit OT Evaluation $OT Eval Moderate Complexity: 1 Mod  Kari Baars, [place] [phone] Office- [phone]     Blaine Guiffre, Edwena Felty D 03/16/2020, 11:54 AM

## 2020-03-16 NOTE — Progress Notes (Signed)
PROGRESS NOTE  Kathryn Bradford ZOX:[id number] DOB: 05/05/1930 DOA: 03/13/2020 [facility]CP: [facility]   LOS: 3 days   Brief narrative: As per HPI,  Kathryn Bradford a 86 y.o.female with past medical history of type 2 diabetes, history of SBO, diverticulitis, aortic insufficiency, chronic diastolic CHF, PAD, HTN, HLD, PE, with Pulmonary infarct/DVT (on chronic anticoagulation, CKD (baseline Cr 1.44 07/18/2017) was brought to the emergency department via EMS from skilled nursing facility due to syncopal episode during bowel movement. She was unable to provide history. The facility reported that the patient had history of COVID almost month back.  She did have some episodes of hypoxia in the skilled nursing facility as well with decreased oral intake.  She was treated with oral ciprofloxacin for 3 days at the skilled nursing facility for UTI.  ED Course:Initial vital signs were temperature97.8 F, pulse 55, respiration 15, blood pressure 98/60 mmHg, 100% on nasal cannula at 3 LPM. Urinalysis had a turbid appearance with large hemoglobinuria, moderate leukocyte esterase, proteinuria 100 mg/dL. She had a more than 50 RBC and more than 50 WBC with rare bacteria on microscopic urine exam. Her CBC showed a white count is 8.1 with 87% neutrophils, following 12.6 g/dL and platelets [id number]. PT was 12.9, INR 1.0 and APTT less than 20. D-dimer was over 20. Lactic acid was normal. Troponin was 59 ng/L. CMP shows a sodium 136, potassium 5.1, chloride 96 and CO2 29 mmol/L. Glucose 178, BUN 94 and creatinine 3.41 mg/dL. Total protein is 8.3 and albumin 3.2 g/dL. Transaminases are normal. Total bilirubin was 1.4 mg/dL.Chest radiograph did not show any acute abnormality. CT head did not show any acute intracranial pathology. The patient received IV fluid bolus and 1 g of ceftriaxone and was admitted to the hospital.   Assessment/Plan:  Principal Problem:   AKI (acute kidney injury) (HCC) Active  Problems:   Type 2 diabetes mellitus (HCC)   Pulmonary emboli (HCC)   Hypertension   Glaucoma   Acute lower UTI   Elevated bilirubin   Elevated troponin   Chronic constipation   Syncope   Diabetes mellitus type 2, uncontrolled, with complications (HCC)   SBO (small bowel obstruction) (HCC)   Diverticulosis   Hx of diverticulitis of colon   Aortic insufficiency   Chronic diastolic CHF (congestive heart failure) (HCC)   PAD (peripheral artery disease) (HCC)   Essential hypertension   HLD (hyperlipidemia)   Hx of pulmonary embolus   Hx of pulmonary infarction   DVT (deep venous thrombosis) (HCC)   CKD (chronic kidney disease), stage III   Chronic anticoagulation   Syncope, likely vasovagal as per history.No significant arrhythmias noted.   2D echo with preserved LV function.  Acute kidney injury on CKD stage IIIb.  Baseline creatinine around 1.4.  Received IV fluid hydration.  Creatinine of 0.8 at this time.  Will restart lisinopril.  History of DVT in chronic pulmonary embolism on Xarelto at home.  Patient was initially on heparin drip which has been changed to Eliquis at this time.  Low probability for new pulmonary embolism.  VQ scan had indeterminate results.  Acute lower UTI.  Continue with IV Rocephin.  Blood culture and urine culture negative so far    Essential hypertension.  On Cardizem and lisinopril at home.  We will continue  Diabetes mellitus type II.Last hemoglobin A1c of 7.7.  Continue sliding scale insulin, Tradjenta.  Diabetic diet.  Closely monitor.  Latest POC glucose of 98  Elevated troponin likely secondary to demand  ischemia.  No chest pain, shortness of breath or EKG changes.  EKG showed normal sinus rhythm.  2D echocardiogram done 03/14/2020 showed left ventricular ejection fraction of 70 to 75% with LVH.  Glaucoma.  Continue Alphagan, Cosopt drops.  Elevated bilirubin.  AST, ALT within normal limits.  Bilirubin normalized at this time.  VTE  Prophylaxis: Eliquis  Code Status: Full code  Family Communication: None today.  Spoke with the patient's daughter yesterday.   Disposition Plan:  . Patient is from skilled nursing facility . Likely disposition to skilled nursing facility likely in 1 to 2 days.  PT OT has seen the patient and recommend skilled nursing facility. . Barriers to discharge:  insurance authorization  Consultants:  None  Procedures:  None  Antibiotics:  . Rocephin IV 03/13/20>  Anti-infectives (From admission, onward)   Start     Dose/Rate Route Frequency Ordered Stop   03/14/20 1000  cefTRIAXone (ROCEPHIN) 1 g in sodium chloride 0.9 % 100 mL IVPB     1 g 200 mL/hr over 30 Minutes Intravenous Every 24 hours 03/13/20 1705     03/13/20 1645  cefTRIAXone (ROCEPHIN) 1 g in sodium chloride 0.9 % 100 mL IVPB     1 g 200 mL/hr over 30 Minutes Intravenous  Once 03/13/20 1635 03/13/20 1728      Subjective: Today, patient was seen and examined at bedside.  Complains of mild cough.  No chest pain no nausea vomiting.   Objective: Vitals:   03/16/20 0622 03/16/20 1300  BP: 132/71 119/74  Pulse: 73 61  Resp: 18   Temp: 98.2 F (36.8 C) 98 F (36.7 C)  SpO2: 97% 91%    Intake/Output Summary (Last 24 hours) at 03/16/2020 1510 Last data filed at 03/16/2020 1100 Gross per 24 hour  Intake 1406.62 ml  Output --  Net 1406.62 ml   Filed Weights   03/13/20 1505 03/13/20 1802  Weight: 55.6 kg 56.6 kg   Body mass index is 20.76 kg/m.   Physical Exam: GENERAL: Patient is alert awake and communicative not in obvious distress.  Communicative.[name] built. HENT: No scleral pallor or icterus. Pupils equally reactive to light. Oral mucosa is moist NECK: is supple, no gross swelling noted. CHEST: Clear to auscultation. No crackles or wheezes.  Diminished breath sounds bilaterally. CVS: S1 and S2 heard, no murmur. Regular rate and rhythm.  ABDOMEN: Soft, non-tender, bowel sounds are present. EXTREMITIES: No  edema.  Left lower leg with mild bruise. CNS: Cranial nerves are intact. No focal motor deficits. SKIN: warm and dry without rashes.  Data Review: I have personally reviewed the following laboratory data and studies,  CBC: Recent Labs  Lab 03/13/20 1417 03/14/20 0426 03/14/20 0940 03/15/20 0421 03/16/20 0449  WBC 8.1 5.8 5.8 4.9 5.9  NEUTROABS 7.0  --  4.3  --   --   HGB 12.6 10.0* 11.2* 9.3* 10.0*  HCT 39.5 30.7* 35.8* 29.0* 31.6*  MCV 97.8 98.1 98.9 97.0 98.1  PLT 140* 119* 137* 116* 127*   Basic Metabolic Panel: Recent Labs  Lab 03/13/20 1417 03/14/20 0426 03/14/20 0940 03/15/20 0421 03/16/20 0449  NA 136 136 136 140 141  K 5.1 4.6 4.6 4.4 4.6  CL 96* 106 104 112* 112*  CO2 29 23 26 23 22   GLUCOSE 178* 134* 157* 123* 127*  BUN 94* 73* 69* 52* 28*  CREATININE 3.41* 1.93* 1.71* 1.17* 0.86  CALCIUM 9.9 8.7* 8.9 8.7* 9.3  MG  --   --  3.3*  --   --   PHOS  --   --  3.3  --   --    Liver Function Tests: Recent Labs  Lab 03/13/20 1417 03/14/20 0426 03/14/20 0940 03/15/20 0421 03/16/20 0449  AST 21 16 20 19 22   ALT 17 12 12 11 14   ALKPHOS 56 43 49 41 44  BILITOT 1.4* 0.7 0.7 0.8 0.4  PROT 8.3* 5.9* 6.6 5.7* 6.0*  ALBUMIN 3.2* 2.2* 2.5* 2.3* 2.3*   No results for input(s): LIPASE, AMYLASE in the last 168 hours. No results for input(s): AMMONIA in the last 168 hours. Cardiac Enzymes: No results for input(s): CKTOTAL, CKMB, CKMBINDEX, TROPONINI in the last 168 hours. BNP (last 3 results) No results for input(s): BNP in the last 8760 hours.  ProBNP (last 3 results) No results for input(s): PROBNP in the last 8760 hours.  CBG: Recent Labs  Lab 03/15/20 1213 03/15/20 1708 03/15/20 2119 03/16/20 0824 03/16/20 1207  GLUCAP 157* 120* 128* 98 162*   Recent Results (from the past 240 hour(s))  Urine culture     Status: None (Preliminary result)   Collection Time: 03/13/20  1:40 PM   Specimen: In/Out Cath Urine  Result Value Ref Range Status   Specimen  Description   Final    IN/OUT CATH URINE Performed at [facility], [address] [place], [place]    Special Requests   Final    NONE Performed at [facility], [address] [place], [place]    Culture   Final    CULTURE REINCUBATED FOR BETTER GROWTH Performed at [facility], [address] [place], [address] [place]    Report Status PENDING  Incomplete  Blood Culture (routine x 2)     Status: None (Preliminary result)   Collection Time: 03/13/20  2:15 PM   Specimen: BLOOD  Result Value Ref Range Status   Specimen Description   Final    BLOOD LEFT ANTECUBITAL Performed at [facility], [address] [place], [place]    Special Requests   Final    BOTTLES DRAWN AEROBIC AND ANAEROBIC Blood Culture adequate volume Performed at [facility], [address] [place], [place]    Culture   Final    NO GROWTH 3 DAYS Performed at [facility], [address] [place], [address] [place]    Report Status PENDING  Incomplete  Blood Culture (routine x 2)     Status: None (Preliminary result)   Collection Time: 03/13/20  2:20 PM   Specimen: BLOOD  Result Value Ref Range Status   Specimen Description   Final    BLOOD RIGHT ANTECUBITAL Performed at [facility], [address], [place], [place]    Special Requests   Final    BOTTLES DRAWN AEROBIC AND ANAEROBIC Blood Culture results may not be optimal due to an inadequate volume of blood received in culture bottles Performed at [facility], [address], [place], [place]    Culture   Final    NO GROWTH 3 DAYS Performed at [facility], [address] [place], [address] [place]    Report Status PENDING  Incomplete  SARS CORONAVIRUS 2 (TAT 6-24 HRS) Nasopharyngeal Nasopharyngeal Swab     Status: None   Collection Time: 03/14/20  6:31  PM   Specimen: Nasopharyngeal Swab  Result Value  Ref Range Status   SARS Coronavirus 2 NEGATIVE NEGATIVE Final    Comment: (NOTE) SARS-CoV-2 target nucleic acids are NOT DETECTED. The SARS-CoV-2 RNA is generally detectable in upper and lower respiratory specimens during the acute phase of infection. Negative results do not preclude SARS-CoV-2 infection, do not rule out co-infections with other pathogens, and should not be used as the sole basis for treatment or other patient management decisions. Negative results must be combined with clinical observations, patient history, and epidemiological information. The expected result is Negative. Fact Sheet for Patients: SugarRoll.be Fact Sheet for Healthcare Providers: https://www.woods-mathews.com/ This test is not yet approved or cleared by the [place] FDA and  has been authorized for detection and/or diagnosis of SARS-CoV-2 by FDA under an Emergency Use Authorization (EUA). This EUA will remain  in effect (meaning this test can be used) for the duration of the COVID-19 declaration under Section 56 4(b)(1) of the Act, 21 U.S.C. section 360bbb-3(b)(1), unless the authorization is terminated or revoked sooner. Performed at [facility], [place] [address], [place], [place] [address]      Studies: VAS [place] LOWER EXTREMITY VENOUS (DVT)  Result Date: 03/15/2020  Lower Venous DVTStudy Indications: Pulmonary embolism.  Limitations: Poor ultrasound/tissue interface. Comparison Study: no prior Performing Technologist: June Leap RDMS, RVT  Examination Guidelines: A complete evaluation includes B-mode imaging, spectral Doppler, color Doppler, and power Doppler as needed of all accessible portions of each vessel. Bilateral testing is considered an integral part of a complete examination. Limited examinations for reoccurring indications may be performed as noted. The reflux portion of the exam is  performed with the patient in reverse Trendelenburg.  +---------+---------------+---------+-----------+----------+--------------+ RIGHT    CompressibilityPhasicitySpontaneityPropertiesThrombus Aging +---------+---------------+---------+-----------+----------+--------------+ CFV      Full           Yes      Yes                                 +---------+---------------+---------+-----------+----------+--------------+ SFJ      Full                                                        +---------+---------------+---------+-----------+----------+--------------+ FV Prox  Full                                                        +---------+---------------+---------+-----------+----------+--------------+ FV Mid   Full                                                        +---------+---------------+---------+-----------+----------+--------------+ FV DistalFull                                                        +---------+---------------+---------+-----------+----------+--------------+ PFV      Full                                                        +---------+---------------+---------+-----------+----------+--------------+  POP      Full           Yes      Yes                                 +---------+---------------+---------+-----------+----------+--------------+ PTV      Full                                                        +---------+---------------+---------+-----------+----------+--------------+ PERO     Full                                                        +---------+---------------+---------+-----------+----------+--------------+   +---------+---------------+---------+-----------+----------+--------------+ LEFT     CompressibilityPhasicitySpontaneityPropertiesThrombus Aging +---------+---------------+---------+-----------+----------+--------------+ CFV      Full           Yes      Yes                                  +---------+---------------+---------+-----------+----------+--------------+ SFJ      Full                                                        +---------+---------------+---------+-----------+----------+--------------+ FV Prox  Full                                                        +---------+---------------+---------+-----------+----------+--------------+ FV Mid   Full                                                        +---------+---------------+---------+-----------+----------+--------------+ FV DistalFull                                                        +---------+---------------+---------+-----------+----------+--------------+ PFV      Full                                                        +---------+---------------+---------+-----------+----------+--------------+ POP      Full           Yes      Yes                                 +---------+---------------+---------+-----------+----------+--------------+  PTV      Full                                                        +---------+---------------+---------+-----------+----------+--------------+ PERO     Full                                                        +---------+---------------+---------+-----------+----------+--------------+     Summary: RIGHT: - There is no evidence of deep vein thrombosis in the lower extremity.  - No cystic structure found in the popliteal fossa.  LEFT: - There is no evidence of deep vein thrombosis in the lower extremity.  - No cystic structure found in the popliteal fossa.  *See table(s) above for measurements and observations. Electronically signed by Waverly Ferrari MD on 03/15/2020 at 8:07:38 AM.    Final       Joycelyn Das, MD  [facility] 03/16/2020

## 2020-03-16 NOTE — Progress Notes (Signed)
Patient coughed up blood clot, cleaned out mouth and paged Dr Tyson Babinski. He states if continues hold eliquis if continues.

## 2020-03-16 NOTE — Plan of Care (Signed)
Patient resting in bed, ate about 50% of breakfast, patient needs assistance with meals because she states her right hand doesn't work as well as it use to. Will continue to monitor and help with meals.  Problem: Activity: Goal: Risk for activity intolerance will decrease Outcome: Progressing   Problem: Safety: Goal: Ability to remain free from injury will improve Outcome: Progressing   Problem: Education: Goal: Knowledge of General Education information will improve Description: Including pain rating scale, medication(s)/side effects and non-pharmacologic comfort measures Outcome: Progressing   Problem: Health Behavior/Discharge Planning: Goal: Ability to manage health-related needs will improve Outcome: Progressing   Problem: Clinical Measurements: Goal: Ability to maintain clinical measurements within normal limits will improve Outcome: Progressing Goal: Will remain free from infection Outcome: Progressing Goal: Diagnostic test results will improve Outcome: Progressing Goal: Respiratory complications will improve Outcome: Progressing Goal: Cardiovascular complication will be avoided Outcome: Progressing   Problem: Activity: Goal: Risk for activity intolerance will decrease Outcome: Progressing   Problem: Nutrition: Goal: Adequate nutrition will be maintained Outcome: Progressing   Problem: Coping: Goal: Level of anxiety will decrease Outcome: Progressing   Problem: Elimination: Goal: Will not experience complications related to bowel motility Outcome: Progressing Goal: Will not experience complications related to urinary retention Outcome: Progressing   Problem: Pain Managment: Goal: General experience of comfort will improve Outcome: Progressing   Problem: Safety: Goal: Ability to remain free from injury will improve Outcome: Progressing   Problem: Skin Integrity: Goal: Risk for impaired skin integrity will decrease Outcome: Progressing   Problem:  Education: Goal: Knowledge of disease and its progression will improve Outcome: Progressing   Problem: Fluid Volume: Goal: Compliance with measures to maintain balanced fluid volume will improve Outcome: Progressing   Problem: Nutritional: Goal: Ability to make healthy dietary choices will improve Outcome: Progressing   Problem: Clinical Measurements: Goal: Complications related to the disease process, condition or treatment will be avoided or minimized Outcome: Progressing

## 2020-03-16 NOTE — Discharge Instructions (Signed)

## 2020-03-16 NOTE — Progress Notes (Signed)
PIV was loose and came out. Catheter was intact. Attempted twice to restart PIV.Unsuccessful.  IV Team order placed to start PIV.

## 2020-03-17 ENCOUNTER — Other Ambulatory Visit: Payer: Self-pay

## 2020-03-17 DIAGNOSIS — E785 Hyperlipidemia, unspecified: Secondary | ICD-10-CM

## 2020-03-17 LAB — BASIC METABOLIC PANEL
Anion gap: 7 (ref 5–15)
BUN: 18 mg/dL (ref 8–23)
CO2: 22 mmol/L (ref 22–32)
Calcium: 9.1 mg/dL (ref 8.9–10.3)
Chloride: 114 mmol/L — ABNORMAL HIGH (ref 98–111)
Creatinine, Ser: 0.77 mg/dL (ref 0.44–1.00)
GFR calc Af Amer: >60 mL/min (ref >60)
GFR calc non Af Amer: >60 mL/min (ref >60)
Glucose, Bld: 149 mg/dL — ABNORMAL HIGH (ref 70–99)
Potassium: 4.5 mmol/L (ref 3.5–5.1)
Sodium: 143 mmol/L (ref 135–145)

## 2020-03-17 LAB — GLUCOSE, CAPILLARY
Glucose-Capillary: 130 mg/dL — ABNORMAL HIGH (ref 70–99)
Glucose-Capillary: 103 mg/dL — ABNORMAL HIGH (ref 70–99)
Glucose-Capillary: 102 mg/dL — ABNORMAL HIGH (ref 70–99)

## 2020-03-17 LAB — CBC
HCT: 28.5 % — ABNORMAL LOW (ref 36.0–46.0)
Hemoglobin: 9 g/dL — ABNORMAL LOW (ref 12.0–15.0)
MCH: 30.9 pg (ref 26.0–34.0)
MCHC: 31.6 g/dL (ref 30.0–36.0)
MCV: 97.9 fL (ref 80.0–100.0)
Platelets: 126 10*3/uL — ABNORMAL LOW (ref 150–400)
RBC: 2.91 MIL/uL — ABNORMAL LOW (ref 3.87–5.11)
RDW: 16.9 % — ABNORMAL HIGH (ref 11.5–15.5)
WBC: 4.4 10*3/uL (ref 4.0–10.5)
nRBC: 0 % (ref 0.0–0.2)

## 2020-03-17 MED ORDER — RIVAROXABAN 20 MG PO TABS: 20.0000 mg | ORAL_TABLET | Freq: Every day | ORAL | Status: AC

## 2020-03-17 MED ORDER — ENSURE ENLIVE PO LIQD: 237.0000 mL | Freq: Two times a day (BID) | ORAL | Status: AC

## 2020-03-17 MED ORDER — POLYETHYLENE GLYCOL 3350 17 G PO PACK: 17.0000 g | PACK | Freq: Every day | ORAL | Status: AC | PRN

## 2020-03-17 MED ORDER — ONDANSETRON HCL 4 MG PO TABS: 4.0000 mg | ORAL_TABLET | Freq: Four times a day (QID) | ORAL | 0 refills | Status: DC | PRN

## 2020-03-17 MED ORDER — QUETIAPINE FUMARATE 25 MG PO TABS: 25.0000 mg | ORAL_TABLET | Freq: Every evening | ORAL | Status: DC | PRN

## 2020-03-17 NOTE — TOC Transition Note (Addendum)
Transition of Care [facility]) - CM/SW Discharge Note   Patient Details  Name: Kathryn Bradford MRN: [id number] Date of Birth: November 03, 1930  [facility] CM/SW Contact:  Darleene Cleaver, LCSW Phone Number: 03/17/2020, 1:24 PM   Clinical Narrative:     CSW received insurance approval for patient to go to [facility].  Auth reference number [id number], start date of today.  Patient to be d/c'ed today to [facility].  Patient and family agreeable to plans will transport via ems RN to call report [phone].  CSW attempted to call patient's daughter, and left a message on voice mail.     Final next level of care: Skilled Nursing Facility Barriers to Discharge: Barriers Resolved   Patient Goals and CMS Choice Patient states their goals for this hospitalization and ongoing recovery are:: To go to rehab, then return back home. CMS Medicare.gov Compare Post Acute Care list provided to:: Patient Represenative (must comment) Choice offered to / list presented to : Adult Children  Discharge Placement   Existing PASRR number confirmed : 03/16/20          Patient [facility] bed at: [facility] Patient to be transferred to facility by: [facility] Name of family member notified: Left message on patient's daughter Clide Cliff [phone] voice mail. Patient and family notified of of transfer: 03/17/20  Discharge Plan and Services In-house Referral: NA              DME Arranged: N/A DME Agency: NA       HH Arranged: NA          Social Determinants of Health (SDOH) Interventions     Readmission Risk Interventions No flowsheet data found.

## 2020-03-17 NOTE — Discharge Summary (Addendum)
Physician Discharge Summary  Kathryn Bradford DGU:[id number] DOB: Aug 13, 1930 DOA: 03/13/2020  PCP: [facility]  Admit date: 03/13/2020 Discharge date: 03/17/2020  Admitted From: [facility]  Discharge disposition: Skilled nursing facility   Recommendations for Outpatient Follow-Up:   Follow up with your primary care provider in one week.  Check CBC, BMP in the next visit. Patient has been continued on Xarelto that she was taking for history of DVT.  She did have 1 episode of coughing up blood but this resolved by itself.  If she continues to have bleeding might need to discuss about discontinuation of anticoagulation. She has been prescribed low-dose Seroquel at night as needed for agitation as per the request from the family.   Discharge Diagnosis:   Principal Problem:   AKI (acute kidney injury) (HCC) Active Problems:   Type 2 diabetes mellitus (HCC)   Pulmonary emboli (HCC)   Hypertension   Glaucoma   Acute lower UTI   Elevated bilirubin   Elevated troponin   Chronic constipation   Syncope   Diabetes mellitus type 2, uncontrolled, with complications (HCC)   SBO (small bowel obstruction) (HCC)   Diverticulosis   Hx of diverticulitis of colon   Aortic insufficiency   Chronic diastolic CHF (congestive heart failure) (HCC)   PAD (peripheral artery disease) (HCC)   Essential hypertension   HLD (hyperlipidemia)   Hx of pulmonary embolus   Hx of pulmonary infarction   DVT (deep venous thrombosis) (HCC)   CKD (chronic kidney disease), stage III   Chronic anticoagulation   Discharge Condition: Improved.  Diet recommendation: Diabetic  Wound care: None.  Code status: Full.   History of Present Illness:   Kathryn Bradford is a 86 y.o. female with past medical history of type 2 diabetes, history of SBO, diverticulitis, aortic insufficiency, chronic diastolic CHF, PAD, HTN, HLD, PE, with Pulmonary infarct/DVT (on chronic anticoagulation, CKD (baseline Cr 1.44  07/18/2017) was brought to the emergency department via EMS from skilled nursing facility due to syncopal episode during bowel movement.  She was unable to provide history.  The facility reported that the patient had history of COVID almost month back.  She did have some episodes of hypoxia in the skilled nursing facility as well with decreased oral intake.  She was treated with oral ciprofloxacin for 3 days at the skilled nursing facility for UTI.   ED Course: Initial vital signs were temperature 97.8 F, pulse 55, respiration 15, blood pressure 98/60 mmHg, 100% on nasal cannula at 3 LPM.  Urinalysis had a turbid appearance with large hemoglobinuria, moderate leukocyte esterase, proteinuria 100 mg/dL.  She had a more than 50 RBC and more than 50 WBC with rare bacteria on microscopic urine exam.  Her CBC showed a white count is 8.1 with 87% neutrophils, following 12.6 g/dL and platelets 956.  PT was 12.9, INR 1.0 and APTT less than 20.  D-dimer was over 20.  Lactic acid was normal.  Troponin was 59 ng/L.  CMP shows a sodium 136, potassium 5.1, chloride 96 and CO2 29 mmol/L.  Glucose 178, BUN 94 and creatinine 3.41 mg/dL.  Total protein is 8.3 and albumin 3.2 g/dL.  Transaminases are normal.  Total bilirubin was 1.4 mg/dL. Chest radiograph did not show any acute abnormality.  CT head did not show any acute intracranial pathology.  The patient received IV fluid bolus and 1 g of ceftriaxone and was admitted to the hospital.     Hospital Course:   Following conditions were  addressed during hospitalization as listed below,  Syncope, likely vasovagal as per history.No significant arrhythmias noted.   2D echo with preserved LV function.   Acute kidney injury on CKD stage IIIb.  Baseline creatinine around 1.4.  Received IV fluid hydration.  Creatinine of 0.8 at this time.  Lisinopril has been restarted.   History of DVT in chronic pulmonary embolism on Xarelto at home.    Low probability for new pulmonary  embolism.  VQ scan had indeterminate results.  Patient will be continued on Xarelto on discharge.  Counseled on bleeding.  Patient did have one episode of coughing up small amounts of blood but subsequently did not have any episodes.  If he continues to have episodes of bleeding anticoagulation might need to be discontinued   Acute lower UTI.    Completed course of IV Rocephin.     Essential hypertension.  On Cardizem and lisinopril at home.  We will continue   Diabetes mellitus type II uncontrolled with hyperglycemia.Last hemoglobin A1c of 7.7.    Continue Diabetic diet, Tradjenta.    Elevated troponin likely secondary to demand ischemia.  No chest pain, shortness of breath or EKG changes.  EKG showed normal sinus rhythm.  2D echocardiogram done 03/14/2020 showed left ventricular ejection fraction of 70 to 75% with LVH.   Glaucoma.  Continue Alphagan, Cosopt drops.    Disposition.  At this time, patient is stable for disposition to skilled nursing facility.  I also spoke with the patient's daughter on the phone and updated her about the clinical condition of the patient and the plan for disposition.  Medical Consultants:   None.  Procedures:    None Subjective:   Today, patient feels okay.  Denies any shortness of breath, cough or chest pain.  Denies urinary urgency frequency dysuria.  Complains of mild knee pain.  Discharge Exam:   Vitals:   03/16/20 2138 03/17/20 0515  BP: (!) 143/75 133/65  Pulse: 66 (!) 51  Resp: 16 16  Temp: 98.1 F (36.7 C) 97.7 F (36.5 C)  SpO2: 98% 99%   Vitals:   03/16/20 0622 03/16/20 1300 03/16/20 2138 03/17/20 0515  BP: 132/71 119/74 (!) 143/75 133/65  Pulse: 73 61 66 (!) 51  Resp: Temp: 98.2 F (36.8 C) 98 F (36.7 C) 98.1 F (36.7 C) 97.7 F (36.5 C)  TempSrc: Oral Oral Oral Oral  SpO2: 97% 91% 98% 99%  Weight:      Height:        General: Alert awake, not in obvious distress, thinly built HENT: pupils equally reacting  to light,  No scleral pallor or icterus noted. Oral mucosa is moist.  Chest:  Clear breath sounds.  Diminished breath sounds bilaterally. No crackles or wheezes.  CVS: S1 &S2 heard. No murmur.  Regular rate and rhythm. Abdomen: Soft, nontender, nondistended.  Bowel sounds are heard.   Extremities: No cyanosis, clubbing or edema.  Peripheral pulses are palpable. Psych: Alert, awake and communicative CNS:  No cranial nerve deficits.  Power equal in all extremities.   Skin: Warm and dry.  No rashes noted.  The results of significant diagnostics from this hospitalization (including imaging, microbiology, ancillary and laboratory) are listed below for reference.     Diagnostic Studies:   CT Head Wo Contrast  Result Date: 03/13/2020 CLINICAL DATA:  Altered mental status EXAM: CT HEAD WITHOUT CONTRAST TECHNIQUE: Contiguous axial images were obtained from the base of the skull through the vertex without  intravenous contrast. COMPARISON:  None. FINDINGS: Brain: There is atrophy and chronic small vessel disease changes. No acute intracranial abnormality. Specifically, no hemorrhage, hydrocephalus, mass lesion, acute infarction, or significant intracranial injury. Vascular: No hyperdense vessel or unexpected calcification. Skull: No acute calvarial abnormality. Sinuses/Orbits: Visualized paranasal sinuses and mastoids clear. Orbital soft tissues unremarkable. Other: None IMPRESSION: Atrophy, chronic microvascular disease. No acute intracranial abnormality. Electronically Signed   By: Charlett Nose M.D.   On: 03/13/2020 15:17   NM Pulmonary Perfusion  Result Date: 03/14/2020 CLINICAL DATA:  Hypoxia. EXAM: NUCLEAR MEDICINE PERFUSION LUNG SCAN TECHNIQUE: Perfusion images were obtained in multiple projections after intravenous injection of radiopharmaceutical. Ventilation scans intentionally deferred if perfusion scan and chest x-ray adequate for interpretation during COVID 19 epidemic. RADIOPHARMACEUTICALS:  1.6  mCi Tc-59m MAA IV COMPARISON:  Chest x-ray 03/13/2020. FINDINGS: Perfusion defect right upper lung possibly secondary to pulmonary infiltrate on chest x-ray. Mild left base subsegmental perfusion defects possibly related to atelectasis noted on chest x-ray. IMPRESSION: Perfusion defects matching chest x-ray abnormalities. This is an indeterminate scan for pulmonary embolus. Electronically Signed   By: Maisie Fus  Register   On: 03/14/2020 12:53   DG Chest Port 1 View  Result Date: 03/13/2020 CLINICAL DATA:  Syncope. EXAM: PORTABLE CHEST 1 VIEW COMPARISON:  February 11, 2020. FINDINGS: Stable cardiomediastinal silhouette. No pneumothorax or pleural effusion is noted. Lungs are clear. Bony thorax is unremarkable. IMPRESSION: No acute cardiopulmonary abnormality seen. Electronically Signed   By: Lupita Raider M.D.   On: 03/13/2020 15:27   ECHOCARDIOGRAM COMPLETE  Result Date: 03/14/2020    ECHOCARDIOGRAM REPORT   Patient Name:   ELLISSA AYO Date of Exam: 03/14/2020 Medical Rec #:  [id number]       Height:       65.0 in Accession #:    [id number]      Weight:       124.8 lb Date of Birth:  10/19/1930        BSA:          1.619 m Patient Age:    86 years        BP:           108/71 mmHg Patient Gender: F               HR:           78 bpm. Exam Location:  Inpatient Procedure: 2D Echo Indications:    pulmonary embolus 415.19  History:        Patient has no prior history of Echocardiogram examinations.                 Risk Factors:Hypertension and Diabetes. Pulmonary emboli.  Sonographer:    Celene Skeen RDCS (AE) Referring Phys: [id number] DAVID MANUEL ORTIZ  Sonographer Comments: very limited mobility & positioning IMPRESSIONS  1. The left ventricular chamber is small and appears "underfilled".. Left ventricular ejection fraction, by estimation, is 70 to 75%. The left ventricle has hyperdynamic function. The left ventricle has no regional wall motion abnormalities. There is mild concentric left ventricular hypertrophy.  Left ventricular diastolic parameters are consistent with Grade I diastolic dysfunction (impaired relaxation).  2. McConnell's sign is seen, consistent with pulmonary embolism. Right ventricular systolic function is moderately reduced. The right ventricular size is mildly enlarged. Tricuspid regurgitation signal is inadequate for assessing PA pressure.  3. The mitral valve is normal in structure. No evidence of mitral valve regurgitation.  4. The aortic valve is normal in structure. Aortic  valve regurgitation is trivial. Mild aortic valve sclerosis is present, with no evidence of aortic valve stenosis.  5. The inferior vena cava is normal in size with <50% respiratory variability, suggesting right atrial pressure of 8 mmHg. FINDINGS  Left Ventricle: The left ventricular chamber is small and appears "underfilled". Left ventricular ejection fraction, by estimation, is 70 to 75%. The left ventricle has hyperdynamic function. The left ventricle has no regional wall motion abnormalities.  The left ventricular internal cavity size was small. There is mild concentric left ventricular hypertrophy. Left ventricular diastolic parameters are consistent with Grade I diastolic dysfunction (impaired relaxation). Indeterminate filling pressures. Right Ventricle: McConnell's sign is seen, consistent with pulmonary embolism. The right ventricular size is mildly enlarged. No increase in right ventricular wall thickness. Right ventricular systolic function is moderately reduced. Tricuspid regurgitation signal is inadequate for assessing PA pressure. Left Atrium: Left atrial size was normal in size. Right Atrium: Right atrial size was normal in size. Pericardium: There is no evidence of pericardial effusion. Mitral Valve: The mitral valve is normal in structure. No evidence of mitral valve regurgitation. Tricuspid Valve: The tricuspid valve is normal in structure. Tricuspid valve regurgitation is not demonstrated. Aortic Valve: The  aortic valve is normal in structure. Aortic valve regurgitation is trivial. Mild aortic valve sclerosis is present, with no evidence of aortic valve stenosis. Pulmonic Valve: The pulmonic valve was not well visualized. Pulmonic valve regurgitation is not visualized. Aorta: The aortic root is normal in size and structure. Venous: The inferior vena cava is normal in size with less than 50% respiratory variability, suggesting right atrial pressure of 8 mmHg. IAS/Shunts: No atrial level shunt detected by color flow Doppler.  LEFT VENTRICLE PLAX 2D LVIDd:         2.20 cm  Diastology LVIDs:         1.60 cm  LV e' lateral:   4.90 cm/s LV PW:         1.40 cm  LV E/e' lateral: 11.4 LV IVS:        1.24 cm  LV e' medial:    6.20 cm/s LVOT diam:     1.90 cm  LV E/e' medial:  9.0 LV SV:         27 LV SV Index:   17 LVOT Area:     2.84 cm  RIGHT VENTRICLE RV S prime:     7.18 cm/s TAPSE (M-mode): 1.1 cm RIGHT ATRIUM          Index RA Area:     8.60 cm RA Volume:   16.40 ml 10.13 ml/m  AORTIC VALVE LVOT Vmax:   65.30 cm/s LVOT Vmean:  50.500 cm/s LVOT VTI:    0.096 m  AORTA Ao Root diam: 3.00 cm MITRAL VALVE MV Area (PHT): 2.42 cm    SHUNTS MV Decel Time: 314 msec    Systemic VTI:  0.10 m MV E velocity: 55.70 cm/s  Systemic Diam: 1.90 cm MV A velocity: 85.30 cm/s MV E/A ratio:  0.65 Mihai Croitoru MD Electronically signed by Thurmon FairMihai Croitoru MD Signature Date/Time: 03/14/2020/10:38:57 AM    Final    VAS US LOWER EXTREMITY VENOUS (DVT)  Result Date: 03/15/2020  Lower Venous DVTStudy Indications: Pulmonary embolism.  Limitations: Poor ultrasound/tissue interface. Comparison Study: no prior Performing Technologist: Jeb LeveringJill Parker RDMS, RVT  Examination Guidelines: A complete evaluation includes B-mode imaging, spectral Doppler, color Doppler, and power Doppler as needed of all accessible portions of each vessel. Bilateral testing is considered an  integral part of a complete examination. Limited examinations for reoccurring  indications may be performed as noted. The reflux portion of the exam is performed with the patient in reverse Trendelenburg.  +---------+---------------+---------+-----------+----------+--------------+ RIGHT    CompressibilityPhasicitySpontaneityPropertiesThrombus Aging +---------+---------------+---------+-----------+----------+--------------+ CFV      Full           Yes      Yes                                 +---------+---------------+---------+-----------+----------+--------------+ SFJ      Full                                                        +---------+---------------+---------+-----------+----------+--------------+ FV Prox  Full                                                        +---------+---------------+---------+-----------+----------+--------------+ FV Mid   Full                                                        +---------+---------------+---------+-----------+----------+--------------+ FV DistalFull                                                        +---------+---------------+---------+-----------+----------+--------------+ PFV      Full                                                        +---------+---------------+---------+-----------+----------+--------------+ POP      Full           Yes      Yes                                 +---------+---------------+---------+-----------+----------+--------------+ PTV      Full                                                        +---------+---------------+---------+-----------+----------+--------------+ PERO     Full                                                        +---------+---------------+---------+-----------+----------+--------------+   +---------+---------------+---------+-----------+----------+--------------+ LEFT     CompressibilityPhasicitySpontaneityPropertiesThrombus Aging  +---------+---------------+---------+-----------+----------+--------------+ CFV  Full           Yes      Yes                                 +---------+---------------+---------+-----------+----------+--------------+ SFJ      Full                                                        +---------+---------------+---------+-----------+----------+--------------+ FV Prox  Full                                                        +---------+---------------+---------+-----------+----------+--------------+ FV Mid   Full                                                        +---------+---------------+---------+-----------+----------+--------------+ FV DistalFull                                                        +---------+---------------+---------+-----------+----------+--------------+ PFV      Full                                                        +---------+---------------+---------+-----------+----------+--------------+ POP      Full           Yes      Yes                                 +---------+---------------+---------+-----------+----------+--------------+ PTV      Full                                                        +---------+---------------+---------+-----------+----------+--------------+ PERO     Full                                                        +---------+---------------+---------+-----------+----------+--------------+     Summary: RIGHT: - There is no evidence of deep vein thrombosis in the lower extremity.  - No cystic structure found in the popliteal fossa.  LEFT: - There is no evidence of deep vein thrombosis in the lower extremity.  - No cystic structure found in the popliteal fossa.  *See table(s) above for measurements and observations.  Electronically signed by Waverly Ferrari MD on 03/15/2020 at 8:07:38 AM.    Final      Labs:   Basic Metabolic Panel: Recent Labs  Lab 03/14/20 0426 03/14/20 0426  03/14/20 0940 03/14/20 0940 03/15/20 0421 03/15/20 0421 03/16/20 0449 03/17/20 0426  NA 136  --  136  --  140  --  141 143  K 4.6   < > 4.6   < > 4.4   < > 4.6 4.5  CL 106  --  104  --  112*  --  112* 114*  CO2 23  --  26  --  23  --  22 22  GLUCOSE 134*  --  157*  --  123*  --  127* 149*  BUN 73*  --  69*  --  52*  --  28* 18  CREATININE 1.93*  --  1.71*  --  1.17*  --  0.86 0.77  CALCIUM 8.7*  --  8.9  --  8.7*  --  9.3 9.1  MG  --   --  3.3*  --   --   --   --   --   PHOS  --   --  3.3  --   --   --   --   --    < > = values in this interval not displayed.   GFR Estimated Creatinine Clearance: 44.3 mL/min (by C-G formula based on SCr of 0.77 mg/dL). Liver Function Tests: Recent Labs  Lab 03/13/20 1417 03/14/20 0426 03/14/20 0940 03/15/20 0421 03/16/20 0449  AST 21 16 20 19 22   ALT 17 12 12 11 14   ALKPHOS 56 43 49 41 44  BILITOT 1.4* 0.7 0.7 0.8 0.4  PROT 8.3* 5.9* 6.6 5.7* 6.0*  ALBUMIN 3.2* 2.2* 2.5* 2.3* 2.3*   No results for input(s): LIPASE, AMYLASE in the last 168 hours. No results for input(s): AMMONIA in the last 168 hours. Coagulation profile Recent Labs  Lab 03/13/20 1417  INR 1.0    CBC: Recent Labs  Lab 03/13/20 1417 03/13/20 1417 03/14/20 0426 03/14/20 0940 03/15/20 0421 03/16/20 0449 03/17/20 0426  WBC 8.1   < > 5.8 5.8 4.9 5.9 4.4  NEUTROABS 7.0  --   --  4.3  --   --   --   HGB 12.6   < > 10.0* 11.2* 9.3* 10.0* 9.0*  HCT 39.5   < > 30.7* 35.8* 29.0* 31.6* 28.5*  MCV 97.8   < > 98.1 98.9 97.0 98.1 97.9  PLT 140*   < > 119* 137* 116* 127* 126*   < > = values in this interval not displayed.   Cardiac Enzymes: No results for input(s): CKTOTAL, CKMB, CKMBINDEX, TROPONINI in the last 168 hours. BNP: Invalid input(s): POCBNP CBG: Recent Labs  Lab 03/16/20 0824 03/16/20 1207 03/16/20 1630 03/16/20 2200 03/17/20 0734  GLUCAP 98 162* 97 130* 103*   D-Dimer No results for input(s): DDIMER in the last 72 hours. Hgb A1c No results  for input(s): HGBA1C in the last 72 hours. Lipid Profile No results for input(s): CHOL, HDL, LDLCALC, TRIG, CHOLHDL, LDLDIRECT in the last 72 hours. Thyroid function studies No results for input(s): TSH, T4TOTAL, T3FREE, THYROIDAB in the last 72 hours.  Invalid input(s): FREET3 Anemia work up No results for input(s): VITAMINB12, FOLATE, FERRITIN, TIBC, IRON, RETICCTPCT in the last 72 hours. Microbiology Recent Results (from the past 240 hour(s))  Urine culture     Status: Abnormal  Collection Time: 03/13/20  1:40 PM   Specimen: In/Out Cath Urine  Result Value Ref Range Status   Specimen Description   Final    IN/OUT CATH URINE Performed at [facility], [address], [place], [place] [address]    Special Requests   Final    NONE Performed at [facility], [address] [place], [place] [address]    Culture (A)  Final    PROPABLE >=100,000 COLONIES/mL AEROCOCCUS SPECIES Standardized susceptibility testing for this organism is not available. Performed at [facility], [address] [place], [place] [address]    Report Status 03/16/2020 FINAL  Final  Blood Culture (routine x 2)     Status: None (Preliminary result)   Collection Time: 03/13/20  2:15 PM   Specimen: BLOOD  Result Value Ref Range Status   Specimen Description   Final    BLOOD LEFT ANTECUBITAL Performed at [facility], [address], [place], [place] [address]    Special Requests   Final    BOTTLES DRAWN AEROBIC AND ANAEROBIC Blood Culture adequate volume Performed at [facility], [address], [place], [place] [address]    Culture   Final    NO GROWTH 4 DAYS Performed at [facility], [address], [place], [place] [address]    Report Status PENDING  Incomplete  Blood Culture (routine x 2)     Status: None (Preliminary result)   Collection Time: 03/13/20  2:20 PM   Specimen: BLOOD  Result Value Ref Range  Status   Specimen Description   Final    BLOOD RIGHT ANTECUBITAL Performed at [facility], [address], [place], [place] [address]    Special Requests   Final    BOTTLES DRAWN AEROBIC AND ANAEROBIC Blood Culture results may not be optimal due to an inadequate volume of blood received in culture bottles Performed at [facility], [address] [place], [place] [address]    Culture   Final    NO GROWTH 4 DAYS Performed at [facility], [address], [place], [place] [address]    Report Status PENDING  Incomplete  SARS CORONAVIRUS 2 (TAT 6-24 HRS) Nasopharyngeal Nasopharyngeal Swab     Status: None   Collection Time: 03/14/20  6:31 PM   Specimen: Nasopharyngeal Swab  Result Value Ref Range Status   SARS Coronavirus 2 NEGATIVE NEGATIVE Final    Comment: (NOTE) SARS-CoV-2 target nucleic acids are NOT DETECTED. The SARS-CoV-2 RNA is generally detectable in upper and lower respiratory specimens during the acute phase of infection. Negative results do not preclude SARS-CoV-2 infection, do not rule out co-infections with other pathogens, and should not be used as the sole basis for treatment or other patient management decisions. Negative results must be combined with clinical observations, patient history, and epidemiological information. The expected result is Negative. Fact Sheet for Patients: HairSlick.no Fact Sheet for Healthcare Providers: quierodirigir.com This test is not yet approved or cleared by the [place] FDA and  has been authorized for detection and/or diagnosis of SARS-CoV-2 by FDA under an Emergency Use Authorization (EUA). This EUA will remain  in effect (meaning this test can be used) for the duration of the COVID-19 declaration under Section 56 4(b)(1) of the Act, 21 U.S.C. section 360bbb-3(b)(1), unless the authorization is terminated or revoked  sooner. Performed at [facility], [address] [place], [place] [address]  Discharge Instructions:   Discharge Instructions     Call MD for:   Complete by: As directed    Worsening symptoms   Diet diabetic Complete by: As directed    Discharge instructions   Complete by: As directed    Follow-up with primary care provider at the skilled nursing facility in 3 to 5 days.   Increase activity slowly   Complete by: As directed       Allergies as of 03/17/2020       Reactions   Penicillins Hives, Itching   Did it involve swelling of the face/tongue/throat, SOB, or low BP? No Did it involve sudden or severe rash/hives, skin peeling, or any reaction on the inside of your mouth or nose? Yes Did you need to seek medical attention at a hospital or doctor's office? Unknown When did it last happen?      Unknown (on MAR) If all above answers are "NO", may proceed with cephalosporin use.        Medication List     STOP taking these medications    dronabinol 2.5 MG capsule Commonly known as: MARINOL       TAKE these medications    acetaminophen 500 MG tablet Commonly known as: TYLENOL Take 1,000 mg by mouth daily.   ascorbic acid 500 MG tablet Commonly known as: VITAMIN C Take 1,000 mg by mouth daily.   brimonidine 0.1 % Soln Commonly known as: ALPHAGAN P Place 1 drop into both eyes 2 (two) times daily.   diltiazem 180 MG 24 hr capsule Commonly known as: TIAZAC Take 180 mg by mouth daily.   dorzolamide-timolol 22.3-6.8 MG/ML ophthalmic solution Commonly known as: COSOPT Place 1 drop into both eyes 2 (two) times daily.   feeding supplement (ENSURE ENLIVE) Liqd Take 237 mLs by mouth 2 (two) times daily between meals.   lisinopril 2.5 MG tablet Commonly known as: ZESTRIL Take 2.5 mg by mouth daily.   magnesium oxide 400 MG tablet Commonly known as: MAG-OX Take 400 mg by mouth at bedtime.   ondansetron 4 MG tablet Commonly known as:  ZOFRAN Take 1 tablet (4 mg total) by mouth every 6 (six) hours as needed for nausea.   Onglyza 5 MG Tabs tablet Generic drug: saxagliptin HCl Take 5 mg by mouth daily.   polyethylene glycol 17 g packet Commonly known as: MIRALAX / GLYCOLAX Take 17 g by mouth daily as needed for mild constipation or moderate constipation. What changed:  when to take this reasons to take this   rivaroxaban 20 MG Tabs tablet Commonly known as: XARELTO Take 1 tablet (20 mg total) by mouth daily with supper.   sennosides-docusate sodium 8.6-50 MG tablet Commonly known as: SENOKOT-S Take 2 tablets by mouth 2 (two) times daily as needed for constipation.   simvastatin 10 MG tablet Commonly known as: ZOCOR Take 10 mg by mouth at bedtime.   VESIcare 10 MG tablet Generic drug: solifenacin Take 10 mg by mouth daily.   Vitamin D3 50 MCG (2000 UT) Tabs Take 50 mcg by mouth daily.   zinc gluconate 50 MG tablet Take 50 mg by mouth daily.           Time coordinating discharge: 39 minutes  Signed:  Linkin Vizzini  [facility] 03/17/2020, 11:19 AM

## 2020-03-17 NOTE — Plan of Care (Signed)
  Problem: Activity: Goal: Risk for activity intolerance will decrease Outcome: Adequate for Discharge   Problem: Education: Goal: Knowledge of General Education information will improve Description: Including pain rating scale, medication(s)/side effects and non-pharmacologic comfort measures Outcome: Progressing

## 2020-03-17 NOTE — Progress Notes (Signed)
Report called to Reuel Boom, RN. At [facility]. Pt discharged in stable condition. SRP, RN

## 2020-03-18 LAB — CULTURE, BLOOD (ROUTINE X 2)
Culture: NO GROWTH
Culture: NO GROWTH
Special Requests: ADEQUATE

## 2020-07-29 IMAGING — DX DG CHEST 1V PORT
1 series · 1 of 1 positions shown · non-contrast
Comparison: February 11, 2020.

CLINICAL DATA: Syncope.

EXAM:
PORTABLE CHEST 1 VIEW

[chest ap]
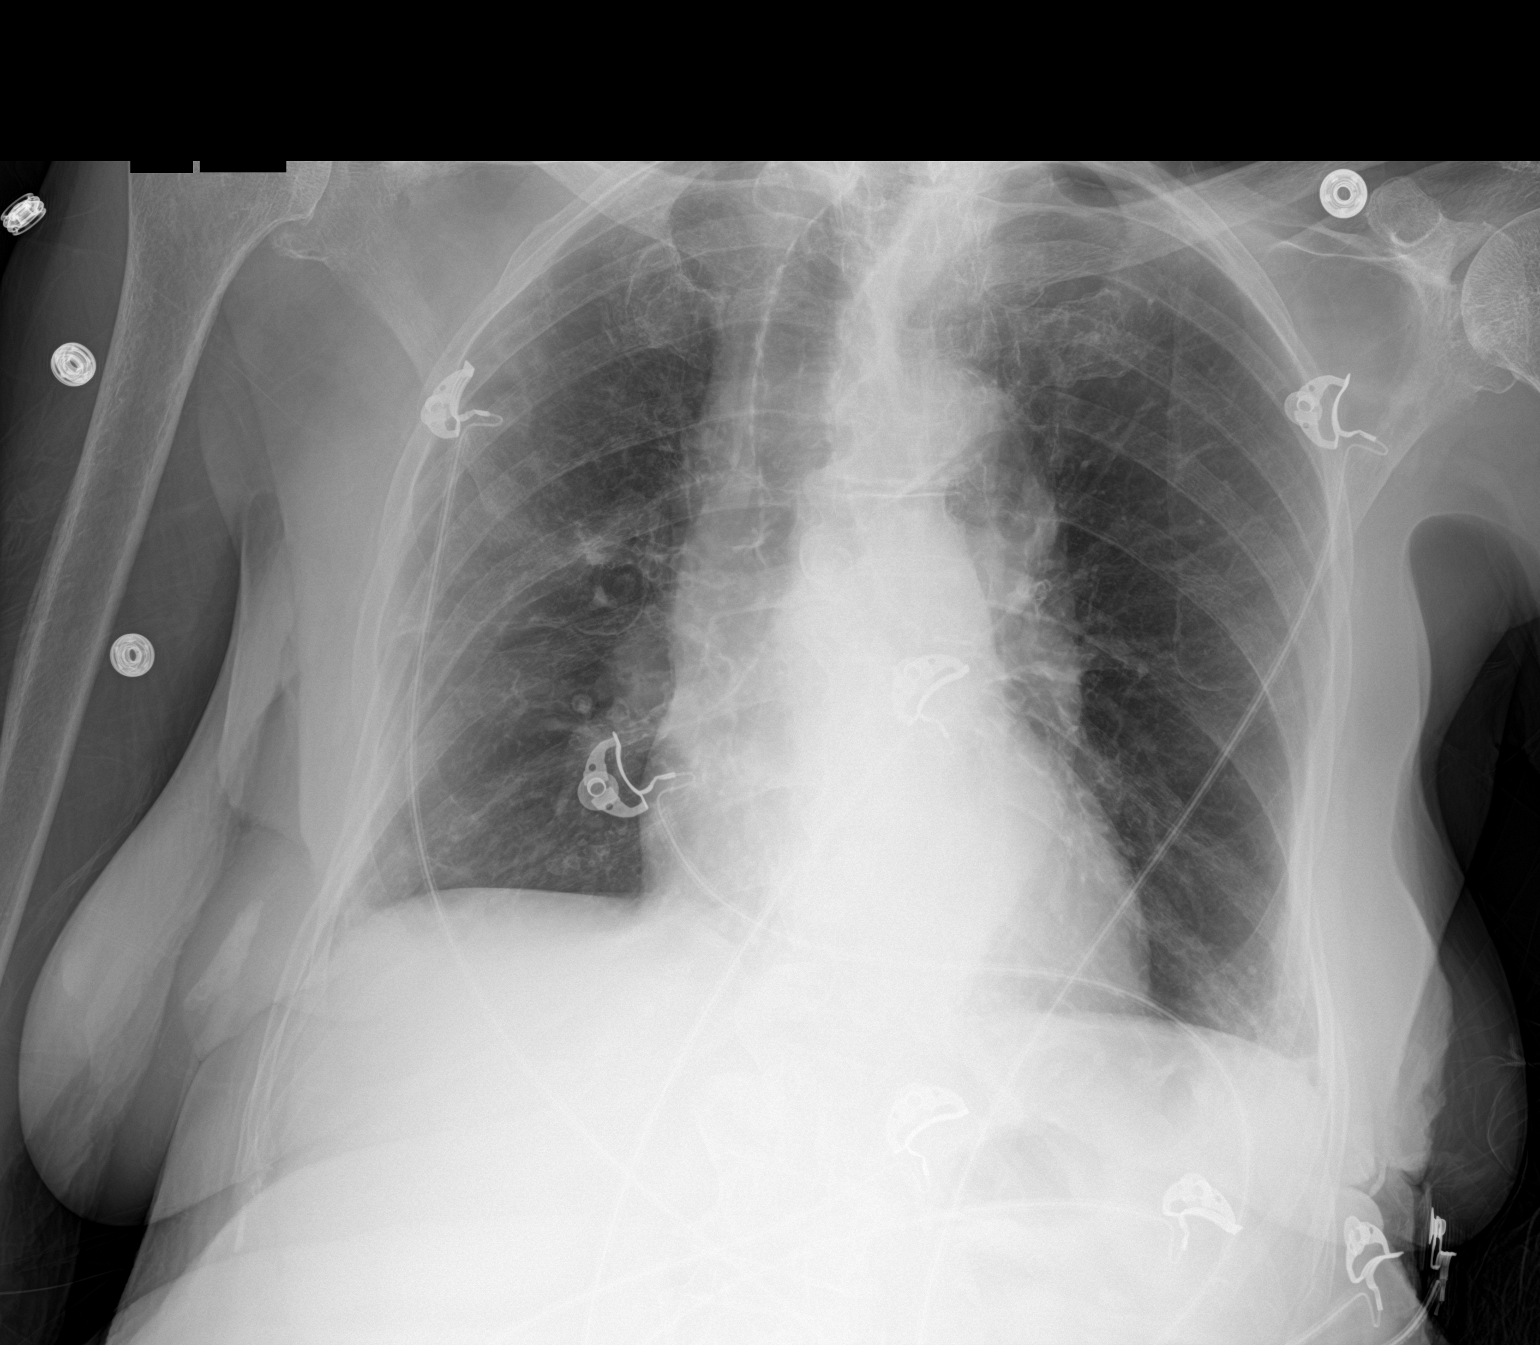

[1 of 1 positions shown; findings below may reference images not displayed]

FINDINGS: Stable cardiomediastinal silhouette. No pneumothorax or pleural
effusion is noted. Lungs are clear. Bony thorax is unremarkable.
IMPRESSION: No acute cardiopulmonary abnormality seen.

## 2022-11-27 NOTE — Discharge Summary (Signed)
 Medicine Discharge Summary Patient ID: Kathryn Bradford 02-13-1930 [id number]   Date of Admission:  11/22/2022  Date of Discharge: 11/27/2022  Admitting Attending: Stanly Oris, MD   Discharging Attending:  Ankur Barnett Blanch, MD   Diagnoses:   Proteus Mirabella's bacteremia Proteus UTI Delirium secondary to UTI, resolved  Hospital Course: Kathryn Bradford is a 88 y.o. female with past medical history notable for PE, rheumatoid arthritis, diabetic peripheral neuropathy, glaucoma, and difficulty with ambulation with mostly relying on wheelchair at home who was brought in by family after she had agitation and confusion that was going on at home for about 1 to 2 weeks.  Patient lives alone and she started having apparently increased urinary frequency and painful urination with foul for smelling urine 1 week prior to hospitalization.  When she arrived to the emergency room here she had positive UA.  There was some intermittent confusion however she was fairly oriented to person and place.  Patient was evaluated with blood cultures and urine culture and was found to have Proteus Mirabella's UTI and bacteremia.  Initially was on Rocephin  for antibiotics and was then transition to ciprofloxacin  per infectious disease.  Patient will be discharged with 2 weeks of antibiotics total and we attempted to get the patient into rehab placement however patient adamantly refused despite urging from staff and family.  Ultimately patient is alert and oriented x 4 and able to make her own decisions.  She was taken home by family on November 27, 2022.  Continue current medications and follow-up closely with primary care provider.   Follow-up Appointments/Instructions  Follow-up with PCP  [facility] APPOINTMENTS    Appointment Date and Time Provider Department Dept Phone Address   12/12/2022 2:30 PM Orland Norleen Mt, DPM [facility] - [phone] [facility] [place] [address]   02/03/2023 2:20 PM Tepedino, Ozell Kohler, MD [facility] - [facility] [phone] [address] [facility] [place] [address]       Contact information for follow-up providers    Claudene Arnulfo Shaker, MD .   Specialties: Family Medicine, Internal Medicine Contact information: [address] [place] [address] [phone]        Claudene Arnulfo Shaker, MD Follow up in 3 day(s).   Specialties: Family Medicine, Internal Medicine Contact information: [address] [place] [address] [phone]            Contact information for after-discharge care    Home Medical Care    [facility] .   Service: Home Health Services Contact information: [address] [place]  [id number] [phone]                  A copy of this note will be sent to PCP on file with us  (if any) to to ensure follow up and continuity of care.  Physical Exam today: Vitals:   11/27/22 0211 11/27/22 0512 11/27/22 0522 11/27/22 0805  BP:   125/55 139/65  Pulse: 63 63 62 78  Temp:   98 F (36.7 C) 98.2 F (36.8 C)  Resp:   18 18  SpO2:   100% 100%  PainSc:      Weight:       GEN: NAD, lying in bed, comfortable,  CV: RRR, no murmurs appreciated PULM: CTA B/L, no wheezing  ABD: soft, NT/ND, EXT:  No edema, Positive pulse.  NEURO: No focal deficits, follow all commands, normal speech  PSYCH: A+Ox3, appropriate  Significant Diagnostic Studies: Results for orders placed or performed during the hospital encounter of 11/22/22  Blood Culture   Specimen: Peripheral; Blood  Result Value Ref Range   Culture, Blood No growth in 4 days   Blood Culture   Specimen: Peripheral; Blood  Result Value Ref Range   Culture, Blood Proteus mirabilis (A)       Susceptibility   Proteus mirabilis - MICRO MIC SUSCEPTIBILITY*    Organism ID       Amoxicillin + K  Clavulanate <=8/4 Susceptible     Ampicillin <=8 Susceptible     Ampicillin/Sulbactam <=4/2 Susceptible     Cefazolin <=2 Susceptible     Cefotaxime <=2 Susceptible     Ceftriaxone  <=1 Susceptible     Ciprofloxacin  <=0.25 Susceptible     Ertapenem <=0.5 Susceptible     Gentamicin <=2 Susceptible     Meropenem <=1 Susceptible     Piperacillin/Tazobactam <=8 Susceptible     Trimethoprim/Sulfamethoxazole <=0.5/9.5 Susceptible     * Identification testing performed by Bruker matrix-assisted laser desorption/ionization time of flight(MALDI-TOF)mass spectrometry.    Antimicrobial susceptibility testing performed by [employer] Microscan Walkaway Plus  Urine Culture   Specimen: Urine  Result Value Ref Range   Urine culture ID 10,000-50,000 CFU/mL Proteus mirabilis (A)       Susceptibility   Proteus mirabilis - MICRO MIC SUSCEPTIBILITY*    Organism ID       Amoxicillin + K Clavulanate <=8/4 Susceptible     Ampicillin <=8 Susceptible     Ampicillin/Sulbactam <=4/2 Susceptible     Cefazolin 4 Susceptible     Cefotaxime <=2 Susceptible     Ceftriaxone  <=1 Susceptible     Cefuroxime <=4 Susceptible     Ciprofloxacin  <=0.25 Susceptible     Ertapenem <=0.5 Susceptible     Gentamicin <=2 Susceptible     Meropenem <=1 Susceptible     Nitrofurantoin >64 Resistant     Piperacillin/Tazobactam <=8 Susceptible     Tetracycline >8 Resistant     Trimethoprim/Sulfamethoxazole <=0.5/9.5 Susceptible     * Identification testing performed by Bruker matrix-assisted laser desorption/ionization time of flight(MALDI-TOF)mass spectrometry.    Antimicrobial susceptibility testing performed by [employer] Microscan Walkaway Plus 1) Results of ampicillin testing can be used to predict results for amoxicillin  2) In the treatment of uncomplicated UTIs, cefazolin susceptibility predicts susceptibility to the oral cephalosporins cephalexin, cefuroxime, cefpodoxime, and cefdinir. Cephalexin is  cost-effective, but QID dosing (normal renal function) is less convenient than the other oral cephalosporins, which are dosed BID. Isolates resistant to cefazolin but susceptible to ceftriaxone  may be susceptible to cefpodoxime.  Blood Culture ID by PCR (Anaerobic Bottle)   Specimen: Peripheral; Blood  Result Value Ref Range   STAPHYLOCOCCUS SPP. Not Detected Not Detected   STAPHYLOCOCCUS AUREUS Not Detected Not Detected   STAPHYLOCOCCUS EPIDERMIDIS Not Detected Not Detected   STAPHYLOCOCCUS LUGDUNENSIS Not Detected Not Detected   ENTEROCOCCUS FAECALIS Not Detected Not Detected   ENTEROCOCCUS FAECIUM Not Detected Not Detected   STREPTOCOCCUS SPP. Not Detected Not Detected   STREPTOCOCCUS AGALACTIAE Not Detected Not Detected   STREPTOCOCCUS PNEUMONIAE Not Detected Not Detected   STREPTOCOCCUS PYOGENES Not Detected Not Detected   LISTERIA MONOCYTOGENES Not Detected Not Detected   ENTEROBACTERALES DETECTED (A) Not Detected   ENTEROBACTER CLOACAE COMPLEX Not Detected Not Detected   ESCHERICHIA COLI Not Detected Not Detected  KLEBSIELLA AEROGENES Not Detected Not Detected   KLEBSIELLA OXYTOCA Not Detected Not Detected   KLEBSIELLA PNEUMONIAE GROUP Not Detected Not Detected   PROTEUS SP DETECTED (A) Not Detected   SALMONELLA SPP. Not Detected Not Detected   SERRATIA MARCESCENS Not Detected Not Detected   PSEUDOMONAS AERUGINOSA Not Detected Not Detected   ACINETOBACTER BAUMANNII COMPLEX Not Detected Not Detected   STENOTROPHOMONAS MALTOPHILIA Not Detected Not Detected   CTX-M Not Detected Not Detected   IMP Not Detected Not Detected   KPC Not Detected Not Detected   NDM Not Detected Not Detected   OXA-48-LIKE Not Detected Not Detected   VIM Not Detected Not Detected   BACTEROIDES FRAGILIS Not Detected Not Detected   HAEMOPHILUS INFLUENZAE Not Detected Not Detected   NEISSERIA MENINGITIDIS Not Detected Not Detected   CANDIDA ALBICANS Not Detected Not Detected   CANDI AURIS Not Detected  Not Detected   CANDIDA GLABRATA Not Detected Not Detected   CANDIDA KRUSEI Not Detected Not Detected   CANDIDA PARAPSILOSIS Not Detected Not Detected   CANDIDA TROPICALIS Not Detected Not Detected   CRYPTOCOCCUS NEOFORMANS/GATTII Not Detected Not Detected  Comprehensive Metabolic Panel  Result Value Ref Range   Sodium 137 135 - 146 MMOL/L   Potassium 4.5 3.5 - 5.3 MMOL/L   Chloride 104 98 - 110 MMOL/L   CO2 28 23 - 30 MMOL/L   BUN 27 (H) 8 - 24 MG/DL   Glucose 811 (H) 70 - 99 MG/DL   Creatinine 8.86 9.49 - 1.50 MG/DL   Calcium 9.9 8.5 - 89.4 MG/DL   Total Protein 7.9 6.0 - 8.3 G/DL   Albumin   3.3 (L) 3.5 - 5.0 G/DL   Total Bilirubin 0.6 0.1 - 1.2 MG/DL   Alkaline Phosphatase 50 25 - 125 IU/L or U/L   AST (SGOT) 14 5 - 40 IU/L or U/L   ALT (SGPT) 7 5 - 50 IU/L or U/L   Anion Gap 5 4 - 14 MMOL/L   Est. GFR 46 (L) >=60 ML/MIN/1.73 M*2   Hold For Urine Culture  Result Value Ref Range   CULTURE,URINE HOLD SAMPLE Culture tube received   Urinalysis With Microscopic  Result Value Ref Range   Urine Color Yellow Colorless, Yellow   Urine Appearance Cloudy (A) Clear   Urine Specific Gravity 1.015 1.005 - 1.025   Urine pH 7.0 4.6 - 8.0   Urine Protein 30 (A) Negative MG/DL   Urine Glucose Negative Negative, Normal, 30 , 50  MG/DL   Urine Ketone Negative Negative MG/DL   Urine Bilirubin Negative Negative   Urine Blood/Hb 1+ (A) Negative   Urine Urobilinogen 0.2 0.2 - 1.0 EU/DL   Urine Nitrite Negative Negative   Urine Leukocyte Esterase 500 (A) Negative   Urine WBC >182 (H) 0 - 5 /HPF   Urine RBC 2 0 - 3 /HPF   Urine Squamous Epithelial Cells Few Few /HPF  CBC  Result Value Ref Range   WBC 9.9 4.4 - 11.0 x 10*3/uL   RBC 3.32 (L) 4.10 - 5.10 x 10*6/uL   Hemoglobin 10.3 (L) 12.3 - 15.3 G/DL   Hematocrit 68.9 (L) 64.0 - 44.6 %   MCV 93.3 80.0 - 96.0 FL   MCH 31.0 27.5 - 33.2 PG   MCHC 33.2 33.0 - 37.0 G/DL   RDW 85.7 87.6 - 82.9 %   Platelets 162 150 - 450 X 10*3/uL   MPV 9.0  6.8 - 10.2 FL  Comprehensive Metabolic Panel  Result  Value Ref Range   Sodium 135 135 - 146 MMOL/L   Potassium 4.4 3.5 - 5.3 MMOL/L   Chloride 103 98 - 110 MMOL/L   CO2 27 23 - 30 MMOL/L   BUN 23 8 - 24 MG/DL   Glucose 853 (H) 70 - 99 MG/DL   Creatinine 9.07 9.49 - 1.50 MG/DL   Calcium 9.4 8.5 - 89.4 MG/DL   Total Protein 7.0 6.0 - 8.3 G/DL   Albumin   2.9 (L) 3.5 - 5.0 G/DL   Total Bilirubin 0.5 0.1 - 1.2 MG/DL   Alkaline Phosphatase 46 25 - 125 IU/L or U/L   AST (SGOT) 12 5 - 40 IU/L or U/L   ALT (SGPT) 6 5 - 50 IU/L or U/L   Anion Gap 5 4 - 14 MMOL/L   Est. GFR 59 (L) >=60 ML/MIN/1.73 M*2   B12, Vitamin  Result Value Ref Range   Vitamin B12 296 200 - 900 pg/mL  TSH, 3rd Generation  Result Value Ref Range   TSH 0.42 (L) 0.45 - 5.33 UIU/ML  CBC  Result Value Ref Range   WBC 5.2 4.4 - 11.0 x 10*3/uL   RBC 3.27 (L) 4.10 - 5.10 x 10*6/uL   Hemoglobin 10.2 (L) 12.3 - 15.3 G/DL   Hematocrit 69.4 (L) 64.0 - 44.6 %   MCV 93.2 80.0 - 96.0 FL   MCH 31.3 27.5 - 33.2 PG   MCHC 33.6 33.0 - 37.0 G/DL   RDW 85.7 87.6 - 82.9 %   Platelets 177 150 - 450 X 10*3/uL   MPV 8.8 6.8 - 10.2 FL  Renal Function Panel  Result Value Ref Range   Sodium 136 135 - 146 MMOL/L   Potassium 3.9 3.5 - 5.3 MMOL/L   Chloride 104 98 - 110 MMOL/L   CO2 25 23 - 30 MMOL/L   BUN 23 8 - 24 MG/DL   Glucose 851 (H) 70 - 99 MG/DL   Calcium 9.4 8.5 - 89.4 MG/DL   Phosphorus 2.4 (L) 2.5 - 4.5 MG/DL   Albumin   2.8 (L) 3.5 - 5.0 G/DL   Creatinine 9.07 9.49 - 1.50 MG/DL   Anion Gap 7 4 - 14 MMOL/L   Est. GFR 59 (L) >=60 ML/MIN/1.73 M*2   Free Thyroxine (Free T4)  Result Value Ref Range   FREE T4 0.9 0.6 - 1.4 NG/DL  POCT Glucose  Result Value Ref Range   POC GLUCOSE COMMENT Recheck/confirm    GLUCOSE, POC, NOVA 92 70 - 99 mg/dL  CBC and Differential  Result Value Ref Range   WBC 8.7 4.4 - 11.0 x 10*3/uL   RBC 3.58 (L) 4.10 - 5.10 x 10*6/uL   Hemoglobin 11.2 (L) 12.3 - 15.3 G/DL   Hematocrit 65.9 (L) 64.0  - 44.6 %   MCV 94.9 80.0 - 96.0 FL   MCH 31.2 27.5 - 33.2 PG   MCHC 32.8 (L) 33.0 - 37.0 G/DL   RDW 85.5 87.6 - 82.9 %   Platelets 169 150 - 450 X 10*3/uL   MPV 8.8 6.8 - 10.2 FL   Neutrophil % 88 %   Lymphocyte % 6 %   Monocyte % 5 %   Eosinophil % 1 %   Basophil % 1 %   Neutrophil Absolute 7.6 1.8 - 7.8 x 10*3/uL   Lymphocyte Absolute 0.5 (L) 1.0 - 4.8 x 10*3/uL   Monocyte Absolute 0.4 0.0 - 0.8 x 10*3/uL   Eosinophil Absolute 0.1 0.0 - 0.5 x 10*3/uL  Basophil Absolute 0.0 0.0 - 0.2 x 10*3/uL   Lab Results  Component Value Date   WBC 5.2 11/25/2022   HGB 10.2 (L) 11/25/2022   HCT 30.5 (L) 11/25/2022   MCV 93.2 11/25/2022   PLT 177 11/25/2022       Imaging: CT Stone Study - Abdomen Pelvis WO  Result Date: 11/24/2022 CLINICAL DATA:  Acute cystitis. Clinical suspicion for pyelonephritis or nephrolithiasis. EXAM: CT ABDOMEN AND PELVIS WITHOUT CONTRAST TECHNIQUE: Multidetector CT imaging of the abdomen and pelvis was performed following the standard protocol without IV contrast. RADIATION DOSE REDUCTION: This exam was performed according to the departmental dose-optimization program which includes automated exposure control, adjustment of the mA and/or kV according to patient size and/or use of iterative reconstruction technique. COMPARISON:  07/18/2017 FINDINGS: Lower chest: No acute findings. Hepatobiliary: No mass visualized on this unenhanced exam. Gallstones are seen, however there is no evidence of cholecystitis or biliary dilatation. Pancreas: No mass or inflammatory process visualized on this unenhanced exam. Spleen:  Within normal limits in size. Adrenals/Urinary tract: No evidence of urolithiasis or hydronephrosis. Fluid attenuation renal cysts again seen bilaterally (no followup imaging is recommended). No evidence of perinephric inflammatory changes or fluid collections. Unremarkable unopacified urinary bladder. Stomach/Bowel: Prior right colectomy again noted. Diffuse  colonic diverticulosis is again seen, without evidence of diverticulitis. No evidence of obstruction, inflammatory process, or abnormal fluid collections. Vascular/Lymphatic: No pathologically enlarged lymph nodes identified. No evidence of abdominal aortic aneurysm. Aortic atherosclerotic calcification incidentally noted. Reproductive: Prior hysterectomy noted. Adnexal regions are unremarkable in appearance. Other: Anterior abdominal wall laxity is stable, without true hernia. Stable small left inguinal hernia, which contains only fat. Musculoskeletal:  No suspicious bone lesions identified. IMPRESSION: No evidence of urolithiasis, hydronephrosis, or other acute findings. Colonic diverticulosis, without radiographic evidence of diverticulitis. Cholelithiasis. No radiographic evidence of cholecystitis. Stable small left inguinal hernia, which contains only fat. Electronically Signed   By: Norleen DELENA Kil M.D.   On: 11/24/2022 10:10   No results found for this or any previous visit (from the past 720 hour(s)). EKG results from current admission-last 7 days    ** No results found for the last 168 hours. **      Disposition: Home-Health Care Svc  Patient Instructions:  Advised to take medications regularly Advised to keep appointments (as scheduled) Advised to call PMD or Report to the ER or Call 911 as appropriate should the symptoms recur or worsen or any new concerning symptoms develop Patient and/or family demonstrate understanding   Discharge Medications:   .    START taking these medications   ciprofloxacin  HCl 500 MG tablet Commonly known as: CIPRO  Dose: 500 mg Instructions: Take 1 tablet (500 mg total) by mouth every 12 hours for 10 days.   cyanocobalamin  500 MCG tablet Commonly known as: VITAMIN B12 Dose: 500 mcg Instructions: Take 1 tablet (500 mcg total) by mouth daily for 30 days.     CHANGE how you take these medications   Rybelsus 3 mg tablet What changed: Another  medication with the same name was removed. Continue taking this medication, and follow the directions you see here. Dose: 1 tablet Instructions: Take 1 tablet (3 mg total) by mouth daily. Generic drug: semaglutide     CONTINUE taking these medications   dicyclomine 20 mg tablet Commonly known as: BENTYL Dose: 20 mg Instructions: Take 1 tablet (20 mg total) by mouth every 6 (six) hours as needed.   dilTIAZem  180 mg 24 hr tablet Commonly known  as: CARDIZEM  LA Dose: 180 mg Instructions: Take 1 tablet (180 mg total) by mouth daily.   donepeziL  5 MG tablet Commonly known as: ARICEPT  Dose: 5 mg Instructions: Take 1 tablet (5 mg total) by mouth nightly.   dorzolamide -timoloL  22.3-6.8 mg/mL ophthalmic solution Commonly known as: COSOPT  Dose: 1 drop Instructions: Place 1 drop into both eyes 2 times daily.   Gemtesa 75 mg tablet Dose: 75 mg Instructions: Take 75 mg by mouth daily. Generic drug: vibegron   latanoprost  0.005 % ophthalmic solution Commonly known as: XALATAN  Instructions: PLACE ONE DROP INTO BOTH EYES AT BED- TIME   lisinopriL  5 MG tablet Commonly known as: PRINIVIL ,ZESTRIL  Dose: 5 mg Instructions: Take 1 tablet (5 mg total) by mouth in the morning.   rivaroxaban  20 mg tablet *ANTICOAGULANT* Commonly known as: XARELTO  Dose: 20 mg Instructions: Take 1 tablet (20 mg total) by mouth daily with dinner.   senna-docusate 8.6-50 mg per tablet Commonly known as: PERICOLACE, SENOKOT-S Dose: 2 tablet Instructions: Take 2 tablets by mouth 2 (two) times daily as needed for up to 30 days for Constipation.   simethicone  125 MG chewable tablet Commonly known as: MYLICON,PHAZYME Dose: 125 mg Instructions: Chew 1 tablet (125 mg total) by mouth every 6 (six) hours as needed for Flatulence.   simvastatin  10 MG tablet Commonly known as: ZOCOR  Dose: 10 mg Instructions: Take 1 tablet (10 mg total) by mouth in the morning.     STOP taking these medications   Accu-Chek Aviva  Plus test strp Strp test strips Generic drug: blood sugar diagnostic   Accu-Chek FastClix Lancing Dev Misc Generic drug: lancets   Accu-Chek Guide Me Glucose Mtr Misc Generic drug: blood-glucose meter   magnesium  oxide 400 mg (241.3 mg magnesium ) tablet Commonly known as: MAG-OX   Onglyza 5 mg Tab tablet Generic drug: SAXagliptin        Discharge Orders and Instructions    Physical Therapy Prescription for Home Health   Complete by: As directed    Action(s): Evaluate and Treat   Occupational Prescription for Home Health   Complete by: As directed    Action(s): Evaluate and Treat   CNA Prescription for Home Health  (Medicare/Medicare HMO/Medicaid With Skilled Need)   Complete by: As directed    Action(s): Assist with ADL's   Diet At Discharge   Complete by: As directed    Recommended diet at discharge: Cardiac diet   Activity At Discharge   Complete by: As directed    Recommended activity at discharge: Activity as tolerated       Time spent on patient on discharge day= less than 30 minutes (This time includes labs/imaging review, physical exam today, discharge instructions to the patient/family, discharge co-ordination, scheduling appointments and preparation of this note)   Electronically signed by: Tobie Mitchael Lope, MD 11/27/22 1201

## 2023-05-01 NOTE — H&P (Signed)
 [facility] Hospitalist History and Physical  Assessment/Plan:  Principal Problem:   Diverticulitis Active Problems:   History of pulmonary embolus (PE)   Long term current use of anticoagulant therapy   PAD (peripheral artery disease) (HCC)   AKI (acute kidney injury) (CMS/HCC) Resolved Problems:   * No resolved hospital problems. *   Ashmi Blas Kazmi is a 89 y.o. female with PMHx DVT/PE on Xarelto  as reviewed in the EMR that presented to [facility] with weakness Is being admitted with Diverticulitis  1.  Diverticulitis: Patient meeting septic criteria on admission with tachycardia and fever Endorgan dysfunction with mild acute kidney injury IV fluid hydration Ceftriaxone /Flagyl   2.  Acute kidney injury: IV fluid hydration, hold lisinopril   3.  History of DVT/PE: Xarelto   4.  Hyperlipidemia specify: Statin therapy  5.  Lower extremity edema: Venous duplex rule out new DVT  CODE Status: DNR  Inpatient  DVT prophylaxis:    rivaroxaban  Anticipated disposition: To Independent Living Facility Estimated discharge:    2-3 days   ___________________________________________________________________  Chief Complaint: Weakness  HPI: MADDUX FIRST is a 89 y.o. female with PMHx as reviewed in the EMR that presented to [facility] with weakness.  89 year old female with the above history who is presenting with weakness.  She is a little difficult to understand and but oriented x 4.  States that she has been feeling weak to the point that she has difficulty walking.  Complained of sharp pain in the calf of her left leg but denies any nauseous vomiting fevers or chills. Workup in the ED: She was febrile, imaging is consistent with diverticulitis She does tell me she has chronic issues with constipation   Allergies: Penicillin  Medications:    Medical History: Past Medical History:  Diagnosis Date  . Diabetes mellitus type II, controlled (CMS/HCC)   . Glaucoma   . Heart disease    . High cholesterol   . Hypertension     Surgical History: Past Surgical History:  Procedure Laterality Date  . CATARACT EXTRACTION Bilateral    Procedure: CATARACT EXTRACTION  . HYSTERECTOMY      Procedure: HYSTERECTOMY; 30's    Social History: Social History   Socioeconomic History  . Marital status: Widowed    Spouse name: Not on file  . Number of children: Not on file  . Years of education: Not on file  . Highest education level: Not on file  Occupational History  . Not on file  Tobacco Use  . Smoking status: Never  . Smokeless tobacco: Current  Substance and Sexual Activity  . Alcohol use: Not Currently  . Drug use: Not Currently  . Sexual activity: Not on file  Other Topics Concern  . Not on file  Social History Narrative  . Not on file   Social Determinants of Health   Food Insecurity: Low Risk  (11/22/2022)   Received from [facility] visits prior to 02/08/2023.   Food   . Within the past 12 months, you worried that your food would run out before you got money to buy more food: Never true   . Within the past 12 months, the food you bought just didn't last and you didn't have money to get more: Never true  Transportation Needs: No Transportation Needs (11/22/2022)   Received from [facility] visits prior to 02/08/2023.   Transportation   . In the past 12 months, has lack of reliable transportation kept you from medical appointments,  meetings, work or from getting things needed for daily living?: No  Safety: Low Risk  (11/22/2022)   Received from [facility] visits prior to 02/08/2023.   Safety   . How often does anyone, including family and friends, physically hurt you?: Never   . How often does anyone, including family and friends, insult or talk down to you?: Never   . How often does anyone, including family and friends, threaten you with harm?: Never   . How often does anyone, including family  and friends, scream or curse at you?: Never  Living Situation: Low Risk  (11/22/2022)   Received from [facility] visits prior to 02/08/2023.   Living Needs   . What is your living situation today?: I have a steady place to live   . Think about the place you live.  Do you have problems with any of the following? (Choose all that apply): None of the above    Family History: Family History  Problem Relation Name Age of Onset  . Hypertension Mother    . Hypertension Brother    . Diabetes Brother    . Glaucoma Neg Hx    . Macular degeneration Neg Hx    . Ankylosing spondylitis Neg Hx    . Rheum arthritis Neg Hx    . Lupus Neg Hx    . Gout Neg Hx    . Psoriasis Neg Hx      Review of Systems: General: No fever, chills, weight changes Skin: No rashes, lesions, wounds Eyes: no discharge, redness, pain HENT: no ear pain, hearing loss, drainage, tinnitus Endocrine: no heat/cold intolerance, no polyuria Respiratory: No cough, wheezes, shortness of breath Cardiovascular: No palpitations, chest pain GI: No nausea, vomiting, diarrhea, positive constipation GU: No dysuria, increased frequency CNS: No numbness, dizziness, headache Musculoskeletal: No back pain, joint pain Blood/lymphatics: No easy bruising, bleeding Mood/affect: No anxiety/depression  Otherwise full 14 point review of systems performed by me is negative unless mentioned in the HPI  Labs/Studies: Available labs and images in EMR personally reviewed   Physical Exam: Vitals:   05/01/23 0200  BP: 136/73  Pulse: (!) 113  Resp: 13  Temp:   SpO2: 93%   General: No acute distress, lying in bed Eyes: extraocular muscles intact, pupils equal round react to light ENT: Moist mucosal membranes, no external lesions neck: No lymphadenopathy, no JVD Cardiovascular: Tachycardic, S1-S2 appreciated Pulmonary: Clear to auscultation bilaterally without wheezes, rhonchi, rales Abdomen: Soft, tenderness to  palpation left lower quadrant without rebound or guarding, nondistended, positive bowel sounds Extremities: 2+ edema left lower extremity 1+ edema right lower extremity Neurological: Cranial nerves intact, no gross neurological deficits Psych: Awake alert oriented 3, appropriate GU: No CVA tenderness, otherwise deferred Musculoskeletal no spinal tenderness

## 2023-05-06 NOTE — Discharge Summary (Signed)
 Hospital Medicine Discharge Summary   Demographics: Kathryn Bradford  89 y.o. [birth year] MRN: [id number]    Extended Emergency Contact Information Primary Emergency Contact: Lucas,Laverne Home Phone: [phone] Work Phone: [phone] Mobile Phone: [phone] Relation: Daughter  DNAR per Portable DNAR  Admit Date: 04/30/2023                            Attending Physician: Jama Rome Louder, MD Discharge Date: 05/06/2023  Primary Care Provider: Arnulfo Jenna Sharps, MD   [phone]  Consults during this admission: Consult Orders             IP CONSULT TO HOSPITALIST       Provider:  (Not yet assigned)              Active & Resolved Diagnosis: Principal Problem (Resolved):   Diverticulitis Active Problems:   History of pulmonary embolus (PE)   Long term current use of anticoagulant therapy   PAD (peripheral artery disease) (HCC)   Impaired functional mobility, balance, gait, and endurance   Impaired mobility and ADLs Resolved Problems:   AKI (acute kidney injury) (CMS/HCC)   Disposition: Patient discharged to home     Scheduled Future Appointments       Provider Department Dept Phone Center   06/30/2023 2:20 PM Ozell Clyde Batman [facility] [phone] [facility] 1565 NUD      History of present illness taken from history and physical dictated 5/23: Kathryn Bradford is a 89 y.o. female with PMHx as reviewed in the EMR that presented to [facility] with weakness.   89 year old female with the above history who is presenting with weakness.  She is a little difficult to understand and but oriented x 4.  States that she has been feeling weak to the point that she has difficulty walking.  Complained of sharp pain in the calf of her left leg but denies any nauseous vomiting fevers or chills. Workup in the ED: She was febrile, imaging is consistent with diverticulitis She does tell me she has chronic  issues with constipation   Hospital Course: Diverticulitis temperature 101.1 degrees at time of arrival.  Following review of patient's allergies, she was started on antibiotic treatment with Rocephin  and Flagyl .  We will have patient complete 6 additional days of antibiotic treatment following discharge from the hospital.   Hypertension, essential blood pressure was noted to be trending up.  Adjustments to medication regimen have improved hypertensive control with blood pressure ranging from 114/53-170/63 in the last 24 hours.  Patient states that when she gets home, she will be able to drink a glass of water with some vinegar and that usually works well to control her blood pressure.   Acute kidney injury possibly secondary to volume depletion.  She was provided with IV hydration and renal indices have returned to baseline on reevaluation   History of DVT/PE continue anticoagulation   Hyperlipidemia continue statin   Lower extremity edema no evidence of deep venous thrombosis on ultrasound.   Weakness greatly appreciate evaluation from occupational and physical therapy team.  Recommendation reportedly for skilled facility for strengthening prior to returning to independent living.  Despite efforts from physicians, nursing staff, therapy teams and clinical care manager, patient declines any consideration of skilled facility placement.  Patient's family is unhappy with this decision.  I agree with their concerns.  Patient tree of mild cognitive impairment but does understand  her current situation and is wanting to be discharged home understanding risks of going home given her debility and poor functional status.  I have tried to emphasize to patient's family that patient has autonomy to make decisions although we do not agree with her choices.  We will arrange for as much home health care support as possible but anticipate readmission shortly when patient realizes her level of dysfunction and  inability to care for herself adequately             Wound / Incision Assessment: Refer to Chart Review and Media Tab for images if available.      Temp:  [97.5 F (36.4 C)-98.7 F (37.1 C)] 98.4 F (36.9 C) Heart Rate:  [66-79] 75 Resp:  [18-19] 19 BP: (118-170)/(49-65) 118/49  General appearance - alert, well appearing, and in no distress Mental status - alert, oriented to person, place, and time Eyes - pupils equal and reactive, extraocular eye movements intact Mouth - mucous membranes moist, pharynx normal without lesions Chest - clear to auscultation, no wheezes, rales or rhonchi, symmetric air entry Heart - normal rate, regular rhythm, normal S1, S2, no murmurs, rubs, clicks or gallops Abdomen - soft, nontender, nondistended, no masses or organomegaly   Anticoagulant Medications     Direct Factor Xa Inhibitors Start End   * rivaroxaban  (Xarelto ) 20 mg tablet 02/12/2020 --   Class: Historical Med   Notes to Pharmacy: Take 1 tablet (20 mg total) by mouth daily with dinner.         Discharge Medications     New Medications      Sig Disp Refill Start End  acetaminophen  325 mg tablet Commonly known as: TYLENOL   Take 2 tablets (650 mg total) by mouth every 6 (six) hours as needed for moderate pain (4-6), headaches or fever 100.4 F or GREATER for up to 10 days.  30 tablet  0     cefdinir 300 mg capsule Commonly known as: OMNICEF  Take 1 capsule (300 mg total) by mouth 2 (two) times a day for 6 days.  12 capsule  0     hydrALAZINE 25 mg tablet Commonly known as: APRESOLINE  Take 3 tablets (75 mg total) by mouth every 8 (eight) hours.  810 tablet  0     metroNIDAZOLE  500 mg tablet Commonly known as: FLAGYL   Take 1 tablet (500 mg total) by mouth 3 (three) times a day for 6 days.  18 tablet  0         Modified Medications      Sig Disp Refill Start End  lisinopriL  20 mg tablet Commonly known as: PRINIVIL  What changed:  medication strength how  much to take  Take 1 tablet (20 mg total) by mouth every morning.  30 tablet  0         Medications To Continue      Sig Disp Refill Start End  cyanocobalamin  500 mcg tablet Commonly known as: VITAMIN B12  Take 500 mcg by mouth Once Daily for 30 days.  30 tablet  0     dicyclomine 20 mg Tab Commonly known as: Bentyl  Take 20 mg by mouth every 6 (six) hours as needed.   0     dilTIAZem  LA 180 mg Tb24 Commonly known as: CARDIZEM  LA  Take 180 mg by mouth Once Daily.   0     donepeziL  5 mg tablet Commonly known as: ARICEPT   Take 5 mg by mouth nightly.  0     dorzolamide -timoloL  22.3-6.8 mg/mL ophthalmic solution Commonly known as: COSOPT   PLACE 1 DROP INTO BOTH EYES TWO TIMES DAILY  10 mL  3     Gemtesa 75 mg Tab Generic drug: vibegron  Take 75 mg by mouth Once Daily.   0     latanoprost  0.005 % ophthalmic solution Commonly known as: XALATAN   PLACE ONE DROP INTO BOTH EYES AT BED- TIME  7.5 mL  5     mirtazapine 15 mg tablet Commonly known as: REMERON  Take 7.5 mg by mouth at bedtime.   0     Rybelsus 3 mg Tab tablet Generic drug: semaglutide  Take 1 tablet by mouth Once Daily.   0     sennosides-docusate sodium  8.6-50 mg per tablet Commonly known as: PERICOLACE  Take 2 tablets by mouth 2 (two) times a day as needed for constipation for up to 30 days.  120 tablet  0     simethicone  125 mg chewable tablet Commonly known as: MYLICON  Take 125 mg by mouth every 6 (six) hours as needed for flatulence.   0     simvastatin  10 mg tablet Commonly known as: ZOCOR   Take 10 mg by mouth every morning.   0     traMADoL  50 mg tablet Commonly known as: ULTRAM   Take 50 mg by mouth 2 (two) times a day.   0     Xarelto  20 mg tablet Generic drug: rivaroxaban   Take 20 mg by mouth daily with dinner.   0         Discharge Orders     Ambulatory referral to PCP     CNA Home Health Coordination     Details:    CNA Actions: Assist with ADL's   DNAR per  Portable DNAR     Lifting Limits:     Details:    Lifting Limits: No lifting limits   Occupational Therapy Home Health Coordination     Details:    Actions: Evaluate and Treat   Physical Therapy Home Health Coordination     Details:    Actions: Evaluate and Treat   Transfer Tub Bench     Details:    Tub Bench Type: Standard   Height (in inches): 1.549 m (5' 1)   Weight (in lbs.): 71.8 kg (158 lb 4.6 oz)   Length of need: Lifetime       Occupational Therapy Recommendations: Rehab Potential: Good Impairments/Limitations: Activity Tolerance Deficits, Range of motion deficits, Balance Deficits, Safety Awareness deficits, Basic Activity of Daily living deficits, IADL deficits, Mobility deficits, Muscle weakness, Coordination Deficits   Physical Therapy Recommendations: Rehabilitation Facility (if pt declines and DC home, will recommend HHPT/OT services and Aide; she will require assist/supervision for all functional mobility and ADL's/IADL's.)     Lab Results  Component Value Date/Time   HGB 10.3 (L) 05/06/2023 01:01 AM   HCT 30.8 (L) 05/06/2023 01:01 AM   WBC 5.05 05/06/2023 01:01 AM   PLT 210 05/06/2023 01:01 AM   Lab Results  Component Value Date/Time   NA 139 05/06/2023 01:01 AM   K 4.2 05/06/2023 01:01 AM   CREATININE 0.84 05/06/2023 01:01 AM   BUN 22 05/06/2023 01:01 AM   GLUCOSE 112 (H) 05/06/2023 01:01 AM    Pertinent Imaging: US  Peripheral Venous Leg Unilat Left  Final Result by Jerilynn Cheryl Pouch, MD (05/24 9142)  [facility]                                                  [facility]                                                    Vascular                                                   [address]                                                  [place]                                                   [place] [address]                                Lower  Extremity Venous Ultrasound Report  Name  JIN, SHOCKLEY Date  05-01-2023 08 04 AM  MRN  [id number]                        Patient Location  [id number]  DOB  [birth year]                      Gender  Female                                Height  61 in  Age  29 yrs                          Ethnicity  3                                  Weight  158 lb  Ordering Physician  OLEH, DAVID KEMP  Referring Physician  HOWER, DAVID KEMP  Performed By  BONNEY CARNE  Interpretation Summary  There is no evidence of DVT or superficial vein thrombophlebitis in the   left lower extremity. Note   While no calf DVT was detected, a single Doppler study cannot exclude calf   DVT completely. If there  is high clinical suspicion, consider a repeat study in 5-7 days to detect   proximal propagation of  calf DVT. No evidence of deep vein thrombosis in the right common femoral   vein. The exam was  technically difficult.  Procedure  A unilateral lower extremity venous duplex exam was performed. The study   was technically difficult  with many images being suboptimal in quality. There is no prior report   available for comparison.  Right Proximal  The right common femoral vein demonstrates complete coaptation of the vein   walls with compression.  Doppler flow analysis is spontaneous, phasic, and augments with distal   compression.  Left Proximal  The left common femoral vein demonstrates complete coaptation of the vein   walls with compression.  Doppler flow analysis is spontaneous, phasic, and augments with distal   compression. The left femoral  vein demonstrates complete  coaptation of the vein walls with compression.   Doppler flow analysis is  spontaneous, phasic, and augments with distal compression. Compression of   the profunda femoral vein  is complete. Spontaneous venous flow in the profunda femoral vein is   present. Distal augmentation of  flow in the profunda femoral vein is present. The left popliteal vein   demonstrates complete  coaptation of the vein walls with compression. Doppler flow analysis is   spontaneous, phasic, and  augments with distal compression.  Left Distal  Compression of the posterior tibial vein is complete. Distal augmentation   of flow in the posterior  tibial vein is present. Compression of the left peroneal vein is complete.   Distal augmentation of  flow in the left peroneal vein is present.  Left Saphenous  Compression of the great saphenous vein is complete.  ___________________________________________________________________________  ___  Electronically signed [place], [id number]   on     05-02-2023 08 57 AM    CT Abdomen Pelvis WO Contrast  Final Result by Dorethia Gerhard Molt, MD (05/23 0120)  CLINICAL DATA:  Flank pain    EXAM:  CT ABDOMEN AND PELVIS WITHOUT CONTRAST    TECHNIQUE:  Multidetector CT imaging of the abdomen and pelvis was performed  following the standard protocol without IV contrast.    RADIATION DOSE REDUCTION: This exam was performed according to the  departmental dose-optimization program which includes automated  exposure control, adjustment of the mA and/or kV according to  patient size and/or use of iterative reconstruction technique.    COMPARISON:  None Available.    FINDINGS:  Lower chest: Bibasilar scarring. Extensive coronary artery  calcification. Global cardiac size within normal limits. Central  pulmonary arteries are enlarged in keeping with changes of pulmonary  arterial hypertension.    Hepatobiliary: Cholelithiasis without pericholecystic  inflammatory  change. Liver unremarkable on this noncontrast examination. No intra  or extrahepatic biliary ductal dilation    Pancreas: Unremarkable    Spleen: Unremarkable    Adrenals/Urinary Tract: The adrenal glands are unremarkable. The  kidneys are normal in size and position. Simple cortical cysts are  seen within the kidneys bilaterally for which no follow-up imaging  is recommended. The kidneys are otherwise unremarkable. Urinary  straight catheter identified within a decompressed bladder lumen.    Stomach/Bowel: Surgical changes of right hemicolectomy are  identified. There is moderate to severe diverticulosis of the  residual colon, most severe within the sigmoid colon. There is  superimposed mild inflammatory stranding and wall thickening  involving a single diverticulum within the distal sigmoid colon,  best seen on axial image # 62/2 and coronal image # 49/4 in keeping  with mild sigmoid diverticulitis. No evidence of obstruction or  perforation. No free intraperitoneal gas or fluid. No loculated  pericolonic fluid collections. The stomach, small bowel, and  residual large bowel are otherwise unremarkable.    Vascular/Lymphatic: Extensive aortoiliac atherosclerotic  calcification with particularly prominent atherosclerotic  calcifications seen at the origin of the mesenteric and renal  arterial vasculature. No aortic aneurysm. No pathologic adenopathy  within the abdomen and pelvis.    Reproductive: Status post hysterectomy. No adnexal masses.    Other: Ventral hernia repair with mesh has been performed. Small fat  containing left inguinal hernia.    Musculoskeletal: Osseous structures are age-appropriate. No acute  bone abnormality. No lytic or blastic bone lesions.    IMPRESSION:  1. Mild sigmoid diverticulitis. No evidence of obstruction or  perforation.  2. Moderate to severe diverticulosis of the residual colon, most  severe within the sigmoid colon.  3.  Extensive coronary artery calcification. Morphologic changes in  keeping with pulmonary arterial hypertension.  4. Cholelithiasis.  5. Extensive aortoiliac atherosclerotic calcification with  particularly prominent atherosclerotic calcifications seen at the  origin of the mesenteric and renal arterial vasculature. If there is  clinical evidence of chronic mesenteric ischemia or hemodynamically  significant renal artery stenosis, CT arteriography may be more  helpful for further evaluation.    Aortic Atherosclerosis (ICD10-I70.0).      Electronically Signed    By: Dorethia Molt M.D.    On: 05/01/2023 01:20      XR Chest 1 View  Final Result by Dorethia Gerhard Molt, MD (05/22 2036)  CLINICAL DATA:  Fever    EXAM:  CHEST  1 VIEW    COMPARISON:  03/13/2020    FINDINGS:  Lungs are clear. Chin overlies the lung apices, however, no definite  pneumothorax identified. No pleural effusion. Cardiac size within  normal limits. Distal tortuosity of the thoracic aorta. Pulmonary  vascularity is normal. Stable vascular shadow within the right  perihilar region. No acute bone abnormality    IMPRESSION:  No active disease.      Electronically Signed    By: Dorethia Molt M.D.    On: 04/30/2023 20:36        Electronically signed by: Jama Rome Louder, MD 05/06/2023 9:59 AM   Time spent on discharge: 35 minutes *Some images could not be shown.

## 2023-08-07 ENCOUNTER — Emergency Department (HOSPITAL_COMMUNITY): Payer: 59

## 2023-08-07 ENCOUNTER — Observation Stay (HOSPITAL_COMMUNITY)
Admission: EM | Admit: 2023-08-07 | Discharge: 2023-08-14 | Disposition: A | Discharge status: Skilled Nursing Facility | Payer: 59 | Attending: Internal Medicine | Admitting: Internal Medicine

## 2023-08-07 ENCOUNTER — Other Ambulatory Visit: Payer: Self-pay

## 2023-08-07 DIAGNOSIS — E440 Moderate protein-calorie malnutrition: Secondary | ICD-10-CM | POA: Insufficient documentation

## 2023-08-07 DIAGNOSIS — N179 Acute kidney failure, unspecified: Secondary | ICD-10-CM

## 2023-08-07 DIAGNOSIS — Z1152 Encounter for screening for COVID-19: Secondary | ICD-10-CM | POA: Diagnosis not present

## 2023-08-07 DIAGNOSIS — R778 Other specified abnormalities of plasma proteins: Secondary | ICD-10-CM | POA: Insufficient documentation

## 2023-08-07 DIAGNOSIS — I130 Hypertensive heart and chronic kidney disease with heart failure and stage 1 through stage 4 chronic kidney disease, or unspecified chronic kidney disease: Secondary | ICD-10-CM | POA: Insufficient documentation

## 2023-08-07 DIAGNOSIS — Z86718 Personal history of other venous thrombosis and embolism: Secondary | ICD-10-CM | POA: Diagnosis not present

## 2023-08-07 DIAGNOSIS — R262 Difficulty in walking, not elsewhere classified: Secondary | ICD-10-CM | POA: Insufficient documentation

## 2023-08-07 DIAGNOSIS — R7989 Other specified abnormal findings of blood chemistry: Secondary | ICD-10-CM | POA: Diagnosis present

## 2023-08-07 DIAGNOSIS — M6281 Muscle weakness (generalized): Secondary | ICD-10-CM | POA: Diagnosis not present

## 2023-08-07 DIAGNOSIS — Z86711 Personal history of pulmonary embolism: Secondary | ICD-10-CM

## 2023-08-07 DIAGNOSIS — E860 Dehydration: Secondary | ICD-10-CM | POA: Diagnosis not present

## 2023-08-07 DIAGNOSIS — R410 Disorientation, unspecified: Secondary | ICD-10-CM | POA: Insufficient documentation

## 2023-08-07 DIAGNOSIS — I10 Essential (primary) hypertension: Secondary | ICD-10-CM

## 2023-08-07 DIAGNOSIS — E1122 Type 2 diabetes mellitus with diabetic chronic kidney disease: Secondary | ICD-10-CM | POA: Diagnosis not present

## 2023-08-07 DIAGNOSIS — D638 Anemia in other chronic diseases classified elsewhere: Secondary | ICD-10-CM | POA: Diagnosis not present

## 2023-08-07 DIAGNOSIS — I5032 Chronic diastolic (congestive) heart failure: Secondary | ICD-10-CM | POA: Insufficient documentation

## 2023-08-07 DIAGNOSIS — R531 Weakness: Secondary | ICD-10-CM | POA: Diagnosis present

## 2023-08-07 DIAGNOSIS — N1831 Chronic kidney disease, stage 3a: Secondary | ICD-10-CM | POA: Diagnosis not present

## 2023-08-07 DIAGNOSIS — R2681 Unsteadiness on feet: Secondary | ICD-10-CM | POA: Diagnosis not present

## 2023-08-07 DIAGNOSIS — G9341 Metabolic encephalopathy: Secondary | ICD-10-CM

## 2023-08-07 DIAGNOSIS — F03A0 Unspecified dementia, mild, without behavioral disturbance, psychotic disturbance, mood disturbance, and anxiety: Secondary | ICD-10-CM

## 2023-08-07 DIAGNOSIS — N183 Chronic kidney disease, stage 3 unspecified: Secondary | ICD-10-CM | POA: Diagnosis present

## 2023-08-07 DIAGNOSIS — Z7901 Long term (current) use of anticoagulants: Secondary | ICD-10-CM | POA: Insufficient documentation

## 2023-08-07 DIAGNOSIS — K5909 Other constipation: Secondary | ICD-10-CM

## 2023-08-07 DIAGNOSIS — N1832 Chronic kidney disease, stage 3b: Secondary | ICD-10-CM

## 2023-08-07 DIAGNOSIS — F0390 Unspecified dementia without behavioral disturbance: Secondary | ICD-10-CM | POA: Diagnosis not present

## 2023-08-07 DIAGNOSIS — R2689 Other abnormalities of gait and mobility: Secondary | ICD-10-CM | POA: Diagnosis not present

## 2023-08-07 DIAGNOSIS — Z8679 Personal history of other diseases of the circulatory system: Secondary | ICD-10-CM | POA: Insufficient documentation

## 2023-08-07 LAB — URINALYSIS, W/ REFLEX TO CULTURE (INFECTION SUSPECTED)
Bacteria, UA: NONE SEEN
Bilirubin Urine: NEGATIVE
Glucose, UA: NEGATIVE mg/dL
Hgb urine dipstick: NEGATIVE
Ketones, ur: NEGATIVE mg/dL
Leukocytes,Ua: NEGATIVE
Nitrite: NEGATIVE
Protein, ur: NEGATIVE mg/dL
Specific Gravity, Urine: 1.015 (ref 1.005–1.030)
pH: 6 (ref 5.0–8.0)

## 2023-08-07 LAB — CBC WITH DIFFERENTIAL/PLATELET
Abs Immature Granulocytes: 0.01 10*3/uL (ref 0.00–0.07)
Basophils Absolute: 0 10*3/uL (ref 0.0–0.1)
Basophils Relative: 1 %
Eosinophils Absolute: 0.1 10*3/uL (ref 0.0–0.5)
Eosinophils Relative: 2 %
HCT: 33 % — ABNORMAL LOW (ref 36.0–46.0)
Hemoglobin: 10.8 g/dL — ABNORMAL LOW (ref 12.0–15.0)
Immature Granulocytes: 0 %
Lymphocytes Relative: 18 %
Lymphs Abs: 0.9 10*3/uL (ref 0.7–4.0)
MCH: 31.7 pg (ref 26.0–34.0)
MCHC: 32.7 g/dL (ref 30.0–36.0)
MCV: 96.8 fL (ref 80.0–100.0)
Monocytes Absolute: 0.4 10*3/uL (ref 0.1–1.0)
Monocytes Relative: 9 %
Neutro Abs: 3.5 10*3/uL (ref 1.7–7.7)
Neutrophils Relative %: 70 %
Platelets: 190 10*3/uL (ref 150–400)
RBC: 3.41 MIL/uL — ABNORMAL LOW (ref 3.87–5.11)
RDW: 14.9 % (ref 11.5–15.5)
WBC: 4.9 10*3/uL (ref 4.0–10.5)
nRBC: 0 % (ref 0.0–0.2)

## 2023-08-07 LAB — BLOOD GAS, VENOUS
Acid-Base Excess: 0.2 mmol/L (ref 0.0–2.0)
Bicarbonate: 26.9 mmol/L (ref 20.0–28.0)
O2 Saturation: 30.2 %
Patient temperature: 37
pCO2, Ven: 51 mmHg (ref 44–60)
pH, Ven: 7.33 (ref 7.25–7.43)
pO2, Ven: <31 mmHg — CL (ref 32–45)

## 2023-08-07 LAB — HEMOGLOBIN A1C
Hgb A1c MFr Bld: 6.8 % — ABNORMAL HIGH (ref 4.8–5.6)
Mean Plasma Glucose: 148.46 mg/dL

## 2023-08-07 LAB — COMPREHENSIVE METABOLIC PANEL
ALT: 11 U/L (ref 0–44)
AST: 19 U/L (ref 15–41)
Albumin: 3.3 g/dL — ABNORMAL LOW (ref 3.5–5.0)
Alkaline Phosphatase: 49 U/L (ref 38–126)
Anion gap: 9 (ref 5–15)
BUN: 35 mg/dL — ABNORMAL HIGH (ref 8–23)
CO2: 26 mmol/L (ref 22–32)
Calcium: 10.5 mg/dL — ABNORMAL HIGH (ref 8.9–10.3)
Chloride: 106 mmol/L (ref 98–111)
Creatinine, Ser: 1.55 mg/dL — ABNORMAL HIGH (ref 0.44–1.00)
GFR, Estimated: 31 mL/min — ABNORMAL LOW (ref >60)
Glucose, Bld: 105 mg/dL — ABNORMAL HIGH (ref 70–99)
Potassium: 4.3 mmol/L (ref 3.5–5.1)
Sodium: 141 mmol/L (ref 135–145)
Total Bilirubin: 0.2 mg/dL — ABNORMAL LOW (ref 0.3–1.2)
Total Protein: 8 g/dL (ref 6.5–8.1)

## 2023-08-07 LAB — TSH: TSH: 0.985 u[IU]/mL (ref 0.350–4.500)

## 2023-08-07 LAB — RETICULOCYTES
Immature Retic Fract: 13.2 % (ref 2.3–15.9)
RBC.: 3.43 MIL/uL — ABNORMAL LOW (ref 3.87–5.11)
Retic Count, Absolute: 44.9 10*3/uL (ref 19.0–186.0)
Retic Ct Pct: 1.3 % (ref 0.4–3.1)

## 2023-08-07 LAB — AMMONIA: Ammonia: 18 umol/L (ref 9–35)

## 2023-08-07 LAB — SODIUM, URINE, RANDOM: Sodium, Ur: 43 mmol/L

## 2023-08-07 LAB — CREATININE, URINE, RANDOM: Creatinine, Urine: 155 mg/dL

## 2023-08-07 LAB — LIPASE, BLOOD: Lipase: 36 U/L (ref 11–51)

## 2023-08-07 LAB — CBG MONITORING, ED: Glucose-Capillary: 148 mg/dL — ABNORMAL HIGH (ref 70–99)

## 2023-08-07 MED ORDER — INSULIN ASPART 100 UNIT/ML IJ SOLN
0.0000 [IU] | INTRAMUSCULAR | Status: DC
  Administered 2023-08-07: 1 [IU] via SUBCUTANEOUS
  Administered 2023-08-08 (×3): 2 [IU] via SUBCUTANEOUS
  Administered 2023-08-09: 1 [IU] via SUBCUTANEOUS
  Administered 2023-08-10 – 2023-08-11 (×3): 2 [IU] via SUBCUTANEOUS
  Administered 2023-08-12 (×2): 1 [IU] via SUBCUTANEOUS
  Administered 2023-08-12: 2 [IU] via SUBCUTANEOUS
  Administered 2023-08-13: 3 [IU] via SUBCUTANEOUS
  Administered 2023-08-13: 1 [IU] via SUBCUTANEOUS
  Administered 2023-08-14: 3 [IU] via SUBCUTANEOUS
  Filled 2023-08-07: qty 0.09

## 2023-08-07 MED ORDER — ACETAMINOPHEN 650 MG RE SUPP: 650.0000 mg | Freq: Four times a day (QID) | RECTAL | Status: DC | PRN

## 2023-08-07 MED ORDER — TRAMADOL HCL 50 MG PO TABS
50.0000 mg | ORAL_TABLET | Freq: Two times a day (BID) | ORAL | Status: DC | PRN
  Administered 2023-08-08 – 2023-08-13 (×4): 50 mg via ORAL
  Filled 2023-08-07 (×4): qty 1

## 2023-08-07 MED ORDER — ONDANSETRON HCL 4 MG PO TABS: 4.0000 mg | ORAL_TABLET | Freq: Four times a day (QID) | ORAL | Status: DC | PRN

## 2023-08-07 MED ORDER — SODIUM CHLORIDE 0.9 % IV SOLN: INTRAVENOUS | Status: AC

## 2023-08-07 MED ORDER — HYDROCODONE-ACETAMINOPHEN 5-325 MG PO TABS: 1.0000 | ORAL_TABLET | ORAL | Status: DC | PRN

## 2023-08-07 MED ORDER — DONEPEZIL HCL 10 MG PO TABS
5.0000 mg | ORAL_TABLET | Freq: Every evening | ORAL | Status: DC
  Administered 2023-08-08 – 2023-08-13 (×6): 5 mg via ORAL
  Filled 2023-08-07 (×6): qty 1

## 2023-08-07 MED ORDER — ACETAMINOPHEN 325 MG PO TABS: 650.0000 mg | ORAL_TABLET | Freq: Four times a day (QID) | ORAL | Status: DC | PRN

## 2023-08-07 MED ORDER — RIVAROXABAN 20 MG PO TABS
20.0000 mg | ORAL_TABLET | Freq: Every day | ORAL | Status: DC
  Administered 2023-08-08 – 2023-08-13 (×7): 20 mg via ORAL
  Filled 2023-08-07 (×7): qty 1

## 2023-08-07 MED ORDER — ONDANSETRON HCL 4 MG/2ML IJ SOLN: 4.0000 mg | Freq: Four times a day (QID) | INTRAMUSCULAR | Status: DC | PRN

## 2023-08-07 MED ORDER — POLYETHYLENE GLYCOL 3350 17 G PO PACK
17.0000 g | PACK | Freq: Every day | ORAL | Status: DC
  Administered 2023-08-08 – 2023-08-14 (×5): 17 g via ORAL
  Filled 2023-08-07 (×5): qty 1

## 2023-08-07 MED ORDER — SODIUM CHLORIDE 0.9 % IV BOLUS
1000.0000 mL | Freq: Once | INTRAVENOUS | Status: AC
  Administered 2023-08-07: 1000 mL via INTRAVENOUS

## 2023-08-07 MED ORDER — LATANOPROST 0.005 % OP SOLN
1.0000 [drp] | Freq: Every day | OPHTHALMIC | Status: DC
  Administered 2023-08-08 – 2023-08-13 (×7): 1 [drp] via OPHTHALMIC
  Filled 2023-08-07: qty 2.5

## 2023-08-07 NOTE — Assessment & Plan Note (Signed)
Obtain electrolytes rehydrate and follow fluid status

## 2023-08-07 NOTE — ED Provider Notes (Signed)
[place] EMERGENCY DEPARTMENT AT [facility] Provider Note  CSN: [id number] Arrival date & time: 08/07/23 1507  Chief Complaint(s) Altered Mental Status  HPI Kathryn Bradford is a 89 y.o. female with primary pulmonary embolism on Eliquis, diabetes, hypertension, prior bowel obstruction, CHF presenting to the emergency department with weakness, confusion.  Patient lives alone, has mild dementia.  Daughter reports that patient was calling her on a confused, stating she was laying in bed all day, not eating or drinking as much.  Patient seems confused and unable to provide much history, but denies fevers, headaches, chest pain, difficulty breathing.  Does report some abdominal pain, cramping in her legs.  Denies urinary symptoms.   Past Medical History Past Medical History:  Diagnosis Date   Bowel obstruction (HCC)    Diabetes mellitus without complication (HCC)    Diverticulitis    Glaucoma    Hernia    Hypertension    Pulmonary emboli [facility])    Patient Active Problem List   Diagnosis Date Noted   Dementia (HCC) 08/07/2023   Acute metabolic encephalopathy 08/07/2023   Diabetes mellitus type 2, uncontrolled, with complications 03/14/2020   SBO (small bowel obstruction) (HCC) 03/14/2020   Diverticulosis 03/14/2020   Hx of diverticulitis of colon 03/14/2020   Aortic insufficiency 03/14/2020   Chronic diastolic CHF (congestive heart failure) (HCC) 03/14/2020   PAD (peripheral artery disease) (HCC) 03/14/2020   Essential hypertension 03/14/2020   HLD (hyperlipidemia) 03/14/2020   Hx of pulmonary embolus 03/14/2020   Hx of pulmonary infarction 03/14/2020   DVT (deep venous thrombosis) (HCC) 03/14/2020   CKD (chronic kidney disease), stage III (HCC) 03/14/2020   Chronic anticoagulation 03/14/2020   AKI (acute kidney injury) (HCC) 03/13/2020   Type 2 diabetes mellitus (HCC) 03/13/2020   Pulmonary emboli (HCC)    Hypertension    Glaucoma    Acute lower UTI    Elevated  bilirubin    Elevated troponin    Chronic constipation    Syncope    Incisional hernia with gangrene and obstruction 10/05/2013   Acute posthemorrhagic anemia 10/05/2013   Type II or unspecified type diabetes mellitus without mention of complication, not stated as uncontrolled 10/05/2013   Phlebitis and thrombophlebitis of other deep vessels of lower extremities 10/05/2013   Other pulmonary embolism and infarction 10/05/2013   Home Medication(s) Prior to Admission medications   Medication Sig Start Date End Date Taking? Authorizing Provider  Cholecalciferol (VITAMIN D3) 50 MCG (2000 UT) TABS Take 2,000 Units by mouth daily.   Yes [provider]  Cobalamin Combinations (B-12 + FOLIC ACID PO) Take 1 tablet by mouth daily.   Yes [provider]  donepezil (ARICEPT) 5 MG tablet Take 5 mg by mouth every evening.   Yes [provider]  dorzolamide-timolol (COSOPT) 2-0.5 % ophthalmic solution Place 1 drop into both eyes 2 (two) times daily.   Yes [provider]  latanoprost (XALATAN) 0.005 % ophthalmic solution Place 1 drop into both eyes at bedtime.   Yes [provider]  MAGNESIUM PO Take 1 tablet by mouth daily.   Yes [provider]  MATZIM LA 180 MG 24 hr tablet Take 180 mg by mouth daily.   Yes [provider]  polyethylene glycol (MIRALAX / GLYCOLAX) 17 g packet Take 17 g by mouth daily as needed for mild constipation or moderate constipation. Patient taking differently: Take 17 g by mouth daily. 03/17/20  Yes Pokhrel, Laxman, MD  rivaroxaban (XARELTO) 20 MG TABS tablet  Take 1 tablet (20 mg total) by mouth daily with supper. 03/17/20  Yes Pokhrel, Laxman, MD  sennosides-docusate sodium (SENOKOT-S) 8.6-50 MG tablet Take 1 tablet by mouth 2 (two) times daily.   Yes [provider]  traMADol (ULTRAM) 50 MG tablet Take 50 mg by mouth every 12 (twelve) hours as needed (for pain).   Yes [provider]  feeding  supplement, ENSURE ENLIVE, (ENSURE ENLIVE) LIQD Take 237 mLs by mouth 2 (two) times daily between meals. Patient not taking: Reported on 08/07/2023 03/17/20   Pokhrel, Rebekah [place], MD  ondansetron (ZOFRAN) 4 MG tablet Take 1 tablet (4 mg total) by mouth every 6 (six) hours as needed for nausea. Patient not taking: Reported on 08/07/2023 03/17/20   Joycelyn Das, MD  zinc gluconate 50 MG tablet Take 50 mg by mouth daily.    [provider]                                                                                                                                    Past Surgical History Past Surgical History:  Procedure Laterality Date   HERNIA REPAIR     Family History Family History  Problem Relation Age of Onset   Hypertension Other     Social History Social History   Tobacco Use   Smoking status: Never   Smokeless tobacco: Never  Substance Use Topics   Alcohol use: No   Drug use: No   Allergies Penicillins  Review of Systems Review of Systems  All other systems reviewed and are negative.   Physical Exam Vital Signs  I have reviewed the triage vital signs BP (!) 154/60 (BP Location: Left Arm)   Pulse (!) 57   Temp 97.8 F (36.6 C) (Oral)   Resp 16   Ht 5\' 5"  (1.651 m)   Wt 57 kg   SpO2 97%   BMI 20.91 kg/m  Physical Exam Vitals and nursing note reviewed.  Constitutional:      General: She is not in acute distress.    Appearance: She is well-developed.  HENT:     Head: Normocephalic and atraumatic.     Mouth/Throat:     Mouth: Mucous membranes are dry.  Eyes:     Pupils: Pupils are equal, round, and reactive to light.  Cardiovascular:     Rate and Rhythm: Normal rate and regular rhythm.     Heart sounds: No murmur heard. Pulmonary:     Effort: Pulmonary effort is normal. No respiratory distress.     Breath sounds: Normal breath sounds.  Abdominal:     General: Abdomen is flat.     Palpations: Abdomen is soft.     Tenderness: There is abdominal  tenderness (lower abdominal).  Musculoskeletal:        General: No tenderness.     Right lower leg: No edema.     Left lower leg: No edema.  Skin:  General: Skin is warm and dry.  Neurological:     General: No focal deficit present.     Mental Status: She is alert.     Cranial Nerves: No cranial nerve deficit.     Sensory: No sensory deficit.     Comments: Oriented to self, not to time or situation, knows she is in the hospital does not know which 1  Psychiatric:        Mood and Affect: Mood normal.        Behavior: Behavior normal.     ED Results and Treatments Labs (all labs ordered are listed, but only abnormal results are displayed) Labs Reviewed  COMPREHENSIVE METABOLIC PANEL - Abnormal; Notable for the following components:      Result Value   Glucose, Bld 105 (*)    BUN 35 (*)    Creatinine, Ser 1.55 (*)    Calcium 10.5 (*)    Albumin 3.3 (*)    Total Bilirubin 0.2 (*)    GFR, Estimated 31 (*)    All other components within normal limits  CBC WITH DIFFERENTIAL/PLATELET - Abnormal; Notable for the following components:   RBC 3.41 (*)    Hemoglobin 10.8 (*)    HCT 33.0 (*)    All other components within normal limits  RETICULOCYTES - Abnormal; Notable for the following components:   RBC. 3.43 (*)    All other components within normal limits  RESP PANEL BY RT-PCR (RSV, FLU A&B, COVID)  RVPGX2  URINALYSIS, W/ REFLEX TO CULTURE (INFECTION SUSPECTED)  LIPASE, BLOOD  TSH  AMMONIA  CREATININE, URINE, RANDOM  SODIUM, URINE, RANDOM  HEMOGLOBIN A1C  VITAMIN B12  FOLATE  IRON AND TIBC  FERRITIN  VITAMIN B1  PROCALCITONIN  CK  MAGNESIUM  PHOSPHORUS  PREALBUMIN  OSMOLALITY, URINE  OSMOLALITY  BLOOD GAS, VENOUS  I-STAT CG4 LACTIC ACID, ED  I-STAT CG4 LACTIC ACID, ED  TROPONIN I (HIGH SENSITIVITY)                                                                                                                          Radiology CT ABDOMEN PELVIS WO  CONTRAST  Result Date: 08/07/2023 CLINICAL DATA:  Acute nonlocalized abdominal pain. Feels constipated and not eating or drinking well. Dementia with increased confusion. EXAM: CT ABDOMEN AND PELVIS WITHOUT CONTRAST TECHNIQUE: Multidetector CT imaging of the abdomen and pelvis was performed following the standard protocol without IV contrast. RADIATION DOSE REDUCTION: This exam was performed according to the departmental dose-optimization program which includes automated exposure control, adjustment of the mA and/or kV according to patient size and/or use of iterative reconstruction technique. COMPARISON:  05/01/2023 FINDINGS: Lower chest: Scarring and bronchitic changes in the lung bases. Hepatobiliary: No focal liver lesions. Cholelithiasis with small stones layering in the gallbladder. No inflammatory changes. No bile duct dilatation. Pancreas: Unremarkable. No pancreatic ductal dilatation or surrounding inflammatory changes. Spleen: Normal in size without focal abnormality. Adrenals/Urinary Tract: No adrenal gland nodules.  Diffuse renal parenchymal atrophy bilaterally. No hydronephrosis or hydroureter. Bilateral renal cysts. Representative cyst on the right lower pole measures 5.6 cm in diameter. No change since prior studies. No imaging follow-up is indicated. Bladder is normal. No renal, ureteral, or bladder stones. Stomach/Bowel: Stomach, small bowel, and colon are not abnormally distended. No wall thickening or inflammatory changes are appreciated. Postoperative right hemicolectomy with ileocolonic anastomosis. Ventral abdominal wall hernia containing small bowel and colon without evidence of proximal obstruction. Colonic diverticulosis without evidence of acute diverticulitis. Vascular/Lymphatic: Prominent calcification of the aorta and major branch vessels. No aneurysm. No significant lymphadenopathy. Reproductive: Status post hysterectomy. No adnexal masses. Other: No free air or free fluid in the  abdomen. Fatty atrophy of the abdominal and hip musculature. Musculoskeletal: Degenerative changes in the spine and hips. No acute bony abnormalities. IMPRESSION: 1. Scarring and bronchitic changes in the lung bases. 2. Cholelithiasis without evidence of acute cholecystitis. 3. No evidence of bowel obstruction or inflammation. Ventral abdominal wall hernia contains small bowel and colon without evidence of proximal obstruction. 4. Prominent aortic atherosclerosis. 5. Colonic diverticulosis without evidence of acute diverticulitis. Electronically Signed   By: Burman Nieves M.D.   On: 08/07/2023 19:38   CT Head Wo Contrast  Result Date: 08/07/2023 CLINICAL DATA:  Minor head trauma. Dementia with increased confusion. EXAM: CT HEAD WITHOUT CONTRAST TECHNIQUE: Contiguous axial images were obtained from the base of the skull through the vertex without intravenous contrast. RADIATION DOSE REDUCTION: This exam was performed according to the departmental dose-optimization program which includes automated exposure control, adjustment of the mA and/or kV according to patient size and/or use of iterative reconstruction technique. COMPARISON:  MRI brain 07/17/2018.  CT head 03/13/2020 FINDINGS: Brain: Diffuse cerebral atrophy. Ventricular dilatation consistent with central atrophy. Low-attenuation changes in the deep white matter consistent with small vessel ischemia. No abnormal extra-axial fluid collections. No mass effect or midline shift. Gray-white matter junctions are distinct. Basal cisterns are not effaced. No acute intracranial hemorrhage. Vascular: No hyperdense vessel or unexpected calcification. Skull: Normal. Negative for fracture or focal lesion. Sinuses/Orbits: No acute finding. Other: None. IMPRESSION: No acute intracranial abnormalities. Chronic atrophy and small vessel ischemic changes. Electronically Signed   By: Burman Nieves M.D.   On: 08/07/2023 19:30   DG Chest Port 1 View  Result Date:  08/07/2023 CLINICAL DATA:  Weakness. EXAM: PORTABLE CHEST 1 VIEW COMPARISON:  Apr 30, 2023. FINDINGS: Stable cardiomediastinal silhouette. Both lungs are clear. The visualized skeletal structures are unremarkable. IMPRESSION: No active disease. Electronically Signed   By: Lupita Raider M.D.   On: 08/07/2023 16:50    Pertinent labs & imaging results that were available during my care of the patient were reviewed by me and considered in my medical decision making (see MDM for details).  Medications Ordered in ED Medications  insulin aspart (novoLOG) injection 0-9 Units (has no administration in time range)  sodium chloride 0.9 % bolus 1,000 mL (0 mLs Intravenous Stopped 08/07/23 2227)  Procedures Procedures  (including critical care time)  Medical Decision Making / ED Course   MDM:  89 year old female presenting to the emergency department with confusion.  Differential includes intracranial process, will check CT head although neurologic exam is nonfocal, could be due to toxic or metabolic abnormality such as electrolyte derangement or dehydration.  Could also be due to infection, will check chest x-ray, urinalysis and also CT abdomen given tenderness on exam.  Will reassess closely.  Given patient lives alone, patient probably will need to be admitted given acute mental status change.  Clinical Course as of 08/07/23 2307  Thu Aug 07, 2023  2057 Patient admitted by Dr. Adela Glimpse for confusion and altered mental status. Does have mild AKI otherwise no acute abnormality on testing.  [WS]    Clinical Course User Index [WS] Lonell Grandchild, MD     Additional history obtained: -Additional history obtained from family and ems -External records from outside source obtained and reviewed including: Chart review including previous notes, labs, imaging,  consultation notes including recent ER visits    Lab Tests: -I ordered, reviewed, and interpreted labs.   The pertinent results include:   Labs Reviewed  COMPREHENSIVE METABOLIC PANEL - Abnormal; Notable for the following components:      Result Value   Glucose, Bld 105 (*)    BUN 35 (*)    Creatinine, Ser 1.55 (*)    Calcium 10.5 (*)    Albumin 3.3 (*)    Total Bilirubin 0.2 (*)    GFR, Estimated 31 (*)    All other components within normal limits  CBC WITH DIFFERENTIAL/PLATELET - Abnormal; Notable for the following components:   RBC 3.41 (*)    Hemoglobin 10.8 (*)    HCT 33.0 (*)    All other components within normal limits  RETICULOCYTES - Abnormal; Notable for the following components:   RBC. 3.43 (*)    All other components within normal limits  RESP PANEL BY RT-PCR (RSV, FLU A&B, COVID)  RVPGX2  URINALYSIS, W/ REFLEX TO CULTURE (INFECTION SUSPECTED)  LIPASE, BLOOD  TSH  AMMONIA  CREATININE, URINE, RANDOM  SODIUM, URINE, RANDOM  HEMOGLOBIN A1C  VITAMIN B12  FOLATE  IRON AND TIBC  FERRITIN  VITAMIN B1  PROCALCITONIN  CK  MAGNESIUM  PHOSPHORUS  PREALBUMIN  OSMOLALITY, URINE  OSMOLALITY  BLOOD GAS, VENOUS  I-STAT CG4 LACTIC ACID, ED  I-STAT CG4 LACTIC ACID, ED  TROPONIN I (HIGH SENSITIVITY)    Notable for dehydration  EKG   EKG Interpretation Date/Time:  Thursday August 07 2023 15:16:55 EDT Ventricular Rate:  63 PR Interval:  179 QRS Duration:  86 QT Interval:  445 QTC Calculation: 456 R Axis:   0  Text Interpretation: Sinus rhythm Borderline low voltage, extremity leads Confirmed by Alvino Blood ([id number]) on 08/07/2023 4:31:46 PM         Imaging Studies ordered: I ordered imaging studies including CT head, CT abdomen, CXR On my interpretation imaging demonstrates no acute process I independently visualized and interpreted imaging. I agree with the radiologist interpretation   Medicines ordered and prescription drug management: Meds  ordered this encounter  Medications   sodium chloride 0.9 % bolus 1,000 mL   insulin aspart (novoLOG) injection 0-9 Units    Order Specific Question:   Correction coverage:    Answer:   Sensitive (thin, NPO, renal)    Order Specific Question:   CBG < 70:    Answer:   Implement Hypoglycemia Standing Orders and  refer to Hypoglycemia Standing Orders sidebar report    Order Specific Question:   CBG 70 - 120:    Answer:   0 units    Order Specific Question:   CBG 121 - 150:    Answer:   1 unit    Order Specific Question:   CBG 151 - 200:    Answer:   2 units    Order Specific Question:   CBG 201 - 250:    Answer:   3 units    Order Specific Question:   CBG 251 - 300:    Answer:   5 units    Order Specific Question:   CBG 301 - 350:    Answer:   7 units    Order Specific Question:   CBG 351 - 400    Answer:   9 units    Order Specific Question:   CBG > 400    Answer:   call MD and obtain STAT lab verification    -I have reviewed the patients home medicines and have made adjustments as needed   Consultations Obtained: I requested consultation with the hospitalist,  and discussed lab and imaging findings as well as pertinent plan - they recommend: admission   Cardiac Monitoring: The patient was maintained on a cardiac monitor.  I personally viewed and interpreted the cardiac monitored which showed an underlying rhythm of: NSR  Social Determinants of Health:  Diagnosis or treatment significantly limited by social determinants of health: lives alone   Reevaluation: After the interventions noted above, I reevaluated the patient and found that their symptoms have improved  Co morbidities that complicate the patient evaluation  Past Medical History:  Diagnosis Date   Bowel obstruction (HCC)    Diabetes mellitus without complication (HCC)    Diverticulitis    Glaucoma    Hernia    Hypertension    Pulmonary emboli (HCC)       Dispostion: Disposition decision including need  for hospitalization was considered, and patient admitted to the hospital.    Final Clinical Impression(s) / ED Diagnoses Final diagnoses:  Dehydration     This chart was dictated using voice recognition software.  Despite best efforts to proofread,  errors can occur which can change the documentation meaning.    Lonell Grandchild, MD 08/07/23 [phone]

## 2023-08-07 NOTE — Assessment & Plan Note (Signed)
Continue Xarelto 

## 2023-08-07 NOTE — Assessment & Plan Note (Signed)
-   most likely multifactorial secondary to combination of dehydration secondary to decreased by mouth intake,    - Will rehydrate   - look for any underlining infection   - Hold contributing medications   - if no improvement may need further imaging to evaluate for CNS pathology pathology such as MRI of the brain   - neurological exam appears to be nonfocal but patient unable to cooperate fully   - VBG orderded   - no history of liver disease ammonia unremarkable

## 2023-08-07 NOTE — Assessment & Plan Note (Signed)
Acute on worsening CKD in the setting of dehydration will rehydrate and follow

## 2023-08-07 NOTE — Subjective & Objective (Signed)
Patient presents with confusion been feeling constipated patient has dementia at baseline but lives alone called her daughter and told her that she is dying at which point   daughter called EMS Patient has known history of PE for which she is on Eliquis and prior bowel obstruction Has been feeling generally weak and confused has had decreased p.o. intake Patient is not aware of any fevers or chills denies any headaches no chest pain but does endorse some abdominal pain which she thinks because she is constipated

## 2023-08-07 NOTE — Assessment & Plan Note (Signed)
No evidence of significant stool burden but does seem to have significant gas distention which could have been contributing to her symptoms

## 2023-08-07 NOTE — Assessment & Plan Note (Signed)
Note increased confusion in the setting of AKI Monitor for any signs of sundowning

## 2023-08-07 NOTE — H&P (Addendum)
Kathryn Bradford DGU:[id number] DOB: 1930-04-10 DOA: 08/07/2023     PCP: Sharmon Revere, MD      Patient arrived to ER on 08/07/23 at 1507 Referred by Attending Lonell Grandchild, MD   Patient coming from:    home Lives alone,     Chief Complaint:   Chief Complaint  Patient presents with   Altered Mental Status    HPI: Kathryn Bradford is a 89 y.o. female with medical history significant of DM2, prior small bowel obstruction, aortic insufficiency, diastolic CHF, peripheral arterial disease, HTN HLD history of PE and DVT, CKD, glaucoma, constipation, dementia    Presented with confusion Patient presents with confusion been feeling constipated patient has dementia at baseline but lives alone called her daughter and told her that she is dying at which point   daughter called EMS Patient has known history of PE for which she is on Eliquis and prior bowel obstruction Has been feeling generally weak and confused has had decreased p.o. intake Patient is not aware of any fevers or chills denies any headaches no chest pain but does endorse some abdominal pain which she thinks because she is constipated    Denies significant ETOH intake   Does not smoke   Lab Results  Component Value Date   SARSCOV2NAA NEGATIVE 03/14/2020    Regarding pertinent Chronic problems:     Hyperlipidemia -  on statins Zocor Lipid Panel      HTN on diltiazem lisinopril   chronic CHF diastolic  - last echo  Recent Results (from the past 95638 hour(s))  ECHOCARDIOGRAM COMPLETE   Collection Time: 03/14/20 10:30 AM  Result Value   Weight 1,996.49   Height 65   BP 108/71   Narrative      ECHOCARDIOGRAM REPORT     IMPRESSIONS    1. The left ventricular chamber is small and appears "underfilled".. Left ventricular ejection fraction, by estimation, is 70 to 75%. The left ventricle has hyperdynamic function. The left ventricle has no regional wall motion abnormalities. There is  mild concentric  left ventricular hypertrophy. Left ventricular diastolic parameters are consistent with Grade I diastolic dysfunction (impaired relaxation).  2. McConnell's sign is seen, consistent with pulmonary embolism. Right ventricular systolic function is moderately reduced. The right ventricular size is mildly enlarged. Tricuspid regurgitation signal is inadequate for assessing PA pressure.  3. The mitral valve is normal in structure. No evidence of mitral valve regurgitation.  4. The aortic valve is normal in structure. Aortic valve regurgitation is trivial. Mild aortic valve sclerosis is present, with no evidence of aortic valve stenosis.  5. The inferior vena cava is normal in size with <50% respiratory variability, suggesting right atrial pressure of 8 mmHg.             DM 2 -  Lab Results  Component Value Date   HGBA1C 7.7 (H) 03/14/2020    diet controlled          Dementia - on Aricept     Chronic anemia - baseline hg Hemoglobin & Hematocrit  Recent Labs    08/07/23 1645  HGB 10.8*   Iron/TIBC/Ferritin/ %Sat    Component Value Date/Time   FERRITIN 245 03/14/2020 0940     While in ER: Clinical Course as of 08/07/23 2235  Thu Aug 07, 2023  2057 Patient admitted by Dr. Adela Glimpse for confusion and altered mental status. Does have mild AKI otherwise no acute abnormality on testing.  [WS]    Clinical Course  User Index [WS] Lonell Grandchild, MD         Lab Orders         Comprehensive metabolic panel         CBC with Differential         Urinalysis, w/ Reflex to Culture (Infection Suspected) -Urine, Clean Catch         Lipase, blood         TSH         Ammonia      CT HEAD   NON acute     CXR -  NON acute  CTabd/pelvis -  nonacute scarring at the lung bases cholelithiasis but no acute cholecystitis no bowel obstruction there is still persistent evidence of ventral abdominal hernia but no incarceration diverticulosis but no diverticulitis     Following Medications  were ordered in ER: Medications  sodium chloride 0.9 % bolus 1,000 mL (1,000 mLs Intravenous New Bag/Given 08/07/23 1707)       ED Triage Vitals  Encounter Vitals Group     BP 08/07/23 1515 (!) 121/58     Systolic BP Percentile --      Diastolic BP Percentile --      Pulse Rate 08/07/23 1515 (!) 57     Resp 08/07/23 1515 15     Temp 08/07/23 1515 98.1 F (36.7 C)     Temp Source 08/07/23 1927 Oral     SpO2 08/07/23 1515 96 %     Weight 08/07/23 1514 125 lb 10.6 oz (57 kg)     Height 08/07/23 1514 5\' 5"  (1.651 m)     Head Circumference --      Peak Flow --      Pain Score 08/07/23 1515 0     Pain Loc --      Pain Education --      Exclude from Growth Chart --   WGNF(62)@     _________________________________________ Significant initial  Findings: Abnormal Labs Reviewed  COMPREHENSIVE METABOLIC PANEL - Abnormal; Notable for the following components:      Result Value   Glucose, Bld 105 (*)    BUN 35 (*)    Creatinine, Ser 1.55 (*)    Calcium 10.5 (*)    Albumin 3.3 (*)    Total Bilirubin 0.2 (*)    GFR, Estimated 31 (*)    All other components within normal limits  CBC WITH DIFFERENTIAL/PLATELET - Abnormal; Notable for the following components:   RBC 3.41 (*)    Hemoglobin 10.8 (*)    HCT 33.0 (*)    All other components within normal limits         ECG: Ordered Personally reviewed and interpreted by me showing: HR : 63 Rhythm: Sinus rhythm Borderline low voltage, extremity leads QTC 456 __________________ The recent clinical data is shown below. Vitals:   08/07/23 1514 08/07/23 1515 08/07/23 1927  BP:  (!) 121/58 (!) 154/60  Pulse:  (!) 57 (!) 57  Resp:  15 16  Temp:  98.1 F (36.7 C) 97.8 F (36.6 C)  TempSrc:   Oral  SpO2:  96% 97%  Weight: 57 kg    Height: 5\' 5"  (1.651 m)      WBC     Component Value Date/Time   WBC 4.9 08/07/2023 1645   LYMPHSABS 0.9 08/07/2023 1645   MONOABS 0.4 08/07/2023 1645   EOSABS 0.1 08/07/2023 1645   BASOSABS 0.0  08/07/2023 1645   Lactic Acid, Venous  Component Value Date/Time   LATICACIDVEN 1.7 03/13/2020 1417       UA  no evidence of UTI   Urine analysis:    Component Value Date/Time   COLORURINE YELLOW 08/07/2023 1646   APPEARANCEUR CLEAR 08/07/2023 1646   LABSPEC 1.015 08/07/2023 1646   PHURINE 6.0 08/07/2023 1646   GLUCOSEU NEGATIVE 08/07/2023 1646   HGBUR NEGATIVE 08/07/2023 1646   BILIRUBINUR NEGATIVE 08/07/2023 1646   KETONESUR NEGATIVE 08/07/2023 1646   PROTEINUR NEGATIVE 08/07/2023 1646   UROBILINOGEN 0.2 08/16/2013 2100   NITRITE NEGATIVE 08/07/2023 1646   LEUKOCYTESUR NEGATIVE 08/07/2023 1646    Results for orders placed or performed during the hospital encounter of 03/13/20  Urine culture     Status: Abnormal   Collection Time: 03/13/20  1:40 PM   Specimen: In/Out Cath Urine  Result Value Ref Range Status   Specimen Description   Final    IN/OUT CATH URINE Performed at [facility], [address], [place], [place] [address]    Special Requests   Final    NONE Performed at [facility], [address], [place], [place] [address]    Culture (A)  Final    PROPABLE >=100,000 COLONIES/mL AEROCOCCUS SPECIES Standardized susceptibility testing for this organism is not available. Performed at [facility], [address], [place], [place] [address]    Report Status 03/16/2020 FINAL  Final  Blood Culture (routine x 2)     Status: None   Collection Time: 03/13/20  2:15 PM   Specimen: BLOOD  Result Value Ref Range Status   Specimen Description   Final    BLOOD LEFT ANTECUBITAL Performed at [facility], [address] [place], [place] [address]    Special Requests   Final    BOTTLES DRAWN AEROBIC AND ANAEROBIC Blood Culture adequate volume Performed at [facility], [address], [place], [place] [address]    Culture   Final    NO GROWTH 5 DAYS Performed at [facility], [address] [place], [place] [address]    Report Status 03/18/2020 FINAL  Final  Blood Culture (routine x 2)     Status: None   Collection Time: 03/13/20  2:20 PM   Specimen: BLOOD  Result Value Ref Range Status   Specimen Description   Final    BLOOD RIGHT ANTECUBITAL Performed at [facility], [address] [place], [place] [address]    Special Requests   Final    BOTTLES DRAWN AEROBIC AND ANAEROBIC Blood Culture results may not be optimal due to an inadequate volume of blood received in culture bottles Performed at [facility] [place] [facility], [address] [place], [place] [address]    Culture   Final    NO GROWTH 5 DAYS Performed at [facility], [address] [place], [place] [address]    Report Status 03/18/2020 FINAL  Final  SARS CORONAVIRUS 2 (TAT 6-24 HRS) Nasopharyngeal Nasopharyngeal Swab     Status: None   Collection Time: 03/14/20  6:31 PM   Specimen: Nasopharyngeal Swab  Result Value Ref Range Status   SARS Coronavirus 2 NEGATIVE NEGATIVE Final        __________________________________________________________ Recent Labs  Lab 08/07/23 1645  NA 141  K 4.3  CO2 26  GLUCOSE 105*  BUN 35*  CREATININE 1.55*  CALCIUM 10.5*    Cr   Up from baseline see below Lab Results  Component Value Date   CREATININE 1.55 (H) 08/07/2023   CREATININE 0.77 03/17/2020   CREATININE 0.86 03/16/2020    Recent Labs  Lab 08/07/23 1645  AST 19  ALT 11  ALKPHOS 49  BILITOT 0.2*  PROT 8.0  ALBUMIN 3.3*   Lab Results  Component Value Date   CALCIUM 10.5 (H) 08/07/2023   PHOS 3.3 03/14/2020    Plt: Lab Results  Component Value Date   PLT 190 08/07/2023    Recent Labs  Lab 08/07/23 1645  WBC 4.9  NEUTROABS 3.5  HGB 10.8*  HCT 33.0*  MCV 96.8  PLT 190    HG/HCT      Component Value Date/Time   HGB 10.8 (L) 08/07/2023 1645   HCT 33.0 (L) 08/07/2023 1645   MCV 96.8 08/07/2023 1645     Recent Labs  Lab  08/07/23 1645  LIPASE 36   Recent Labs  Lab 08/07/23 1645  AMMONIA 18     __________________________________________ Hospitalist was called for admission for   Dehydration, AKI      The following Work up has been ordered so far:  Orders Placed This Encounter  Procedures   CT Head Wo Contrast   DG Chest Port 1 View   CT ABDOMEN PELVIS WO CONTRAST   Comprehensive metabolic panel   CBC with Differential   Urinalysis, w/ Reflex to Culture (Infection Suspected) -Urine, Clean Catch   Lipase, blood   TSH   Ammonia   Consult to hospitalist   EKG 12-Lead     OTHER Significant initial  Findings:  labs showing:     DM  labs:  HbA1C: No results for input(s): "HGBA1C" in the last 8760 hours.     CBG (last 3)  No results for input(s): "GLUCAP" in the last 72 hours.        Cultures:    Component Value Date/Time   SDES  03/13/2020 1420    BLOOD RIGHT ANTECUBITAL Performed at [facility], [address], [place], [place] [address]    SPECREQUEST  03/13/2020 1420    BOTTLES DRAWN AEROBIC AND ANAEROBIC Blood Culture results may not be optimal due to an inadequate volume of blood received in culture bottles Performed at [facility], [address], [place], [place] [address]    CULT  03/13/2020 1420    NO GROWTH 5 DAYS Performed at [facility], [address] [place], [place] [address]    REPTSTATUS 03/18/2020 FINAL 03/13/2020 1420     Radiological Exams on Admission: CT ABDOMEN PELVIS WO CONTRAST  Result Date: 08/07/2023 CLINICAL DATA:  Acute nonlocalized abdominal pain. Feels constipated and not eating or drinking well. Dementia with increased confusion. EXAM: CT ABDOMEN AND PELVIS WITHOUT CONTRAST TECHNIQUE: Multidetector CT imaging of the abdomen and pelvis was performed following the standard protocol without IV contrast. RADIATION DOSE REDUCTION: This exam was performed according to the departmental dose-optimization  program which includes automated exposure control, adjustment of the mA and/or kV according to patient size and/or use of iterative reconstruction technique. COMPARISON:  05/01/2023 FINDINGS: Lower chest: Scarring and bronchitic changes in the lung bases. Hepatobiliary: No focal liver lesions. Cholelithiasis with small stones layering in the gallbladder. No inflammatory changes. No bile duct dilatation. Pancreas: Unremarkable. No pancreatic ductal dilatation or surrounding inflammatory changes. Spleen: Normal in size without focal abnormality. Adrenals/Urinary Tract: No adrenal gland nodules. Diffuse renal parenchymal atrophy bilaterally. No hydronephrosis or hydroureter. Bilateral renal cysts. Representative cyst on the right  lower pole measures 5.6 cm in diameter. No change since prior studies. No imaging follow-up is indicated. Bladder is normal. No renal, ureteral, or bladder stones. Stomach/Bowel: Stomach, small bowel, and colon are not abnormally distended. No wall thickening or inflammatory changes are appreciated. Postoperative right hemicolectomy with ileocolonic anastomosis. Ventral abdominal wall hernia containing small bowel and colon without evidence of proximal obstruction. Colonic diverticulosis without evidence of acute diverticulitis. Vascular/Lymphatic: Prominent calcification of the aorta and major branch vessels. No aneurysm. No significant lymphadenopathy. Reproductive: Status post hysterectomy. No adnexal masses. Other: No free air or free fluid in the abdomen. Fatty atrophy of the abdominal and hip musculature. Musculoskeletal: Degenerative changes in the spine and hips. No acute bony abnormalities. IMPRESSION: 1. Scarring and bronchitic changes in the lung bases. 2. Cholelithiasis without evidence of acute cholecystitis. 3. No evidence of bowel obstruction or inflammation. Ventral abdominal wall hernia contains small bowel and colon without evidence of proximal obstruction. 4. Prominent  aortic atherosclerosis. 5. Colonic diverticulosis without evidence of acute diverticulitis. Electronically Signed   By: Burman Nieves M.D.   On: 08/07/2023 19:38   CT Head Wo Contrast  Result Date: 08/07/2023 CLINICAL DATA:  Minor head trauma. Dementia with increased confusion. EXAM: CT HEAD WITHOUT CONTRAST TECHNIQUE: Contiguous axial images were obtained from the base of the skull through the vertex without intravenous contrast. RADIATION DOSE REDUCTION: This exam was performed according to the departmental dose-optimization program which includes automated exposure control, adjustment of the mA and/or kV according to patient size and/or use of iterative reconstruction technique. COMPARISON:  MRI brain 07/17/2018.  CT head 03/13/2020 FINDINGS: Brain: Diffuse cerebral atrophy. Ventricular dilatation consistent with central atrophy. Low-attenuation changes in the deep white matter consistent with small vessel ischemia. No abnormal extra-axial fluid collections. No mass effect or midline shift. Gray-white matter junctions are distinct. Basal cisterns are not effaced. No acute intracranial hemorrhage. Vascular: No hyperdense vessel or unexpected calcification. Skull: Normal. Negative for fracture or focal lesion. Sinuses/Orbits: No acute finding. Other: None. IMPRESSION: No acute intracranial abnormalities. Chronic atrophy and small vessel ischemic changes. Electronically Signed   By: Burman Nieves M.D.   On: 08/07/2023 19:30   DG Chest Port 1 View  Result Date: 08/07/2023 CLINICAL DATA:  Weakness. EXAM: PORTABLE CHEST 1 VIEW COMPARISON:  Apr 30, 2023. FINDINGS: Stable cardiomediastinal silhouette. Both lungs are clear. The visualized skeletal structures are unremarkable. IMPRESSION: No active disease. Electronically Signed   By: Lupita Raider M.D.   On: 08/07/2023 16:50   _______________________________________________________________________________________________________ Latest  Blood pressure  (!) 154/60, pulse (!) 57, temperature 97.8 F (36.6 C), temperature source Oral, resp. rate 16, height 5\' 5"  (1.651 m), weight 57 kg, SpO2 97%.   Vitals  labs and radiology finding personally reviewed  Review of Systems:    Pertinent positives include:  constipation  Constitutional:  No weight loss, night sweats, Fevers, chills, fatigue, weight loss  HEENT:  No headaches, Difficulty swallowing,Tooth/dental problems,Sore throat,  No sneezing, itching, ear ache, nasal congestion, post nasal drip,  Cardio-vascular:  No chest pain, Orthopnea, PND, anasarca, dizziness, palpitations.no Bilateral lower extremity swelling  GI:  No heartburn, indigestion, abdominal pain, nausea, vomiting, diarrhea, change in bowel habits, loss of appetite, melena, blood in stool, hematemesis Resp:  no shortness of breath at rest. No dyspnea on exertion, No excess mucus, no productive cough, No non-productive cough, No coughing up of blood.No change in color of mucus.No wheezing. Skin:  no rash or lesions. No jaundice GU:  no dysuria, change  in color of urine, no urgency or frequency. No straining to urinate.  No flank pain.  Musculoskeletal:  No joint pain or no joint swelling. No decreased range of motion. No back pain.  Psych:  No change in mood or affect. No depression or anxiety. No memory loss.  Neuro: no localizing neurological complaints, no tingling, no weakness, no double vision, no gait abnormality, no slurred speech, no confusion  All systems reviewed and apart from HOPI all are negative _______________________________________________________________________________________________ Past Medical History:   Past Medical History:  Diagnosis Date   Bowel obstruction (HCC)    Diabetes mellitus without complication (HCC)    Diverticulitis    Glaucoma    Hernia    Hypertension    Pulmonary emboli (HCC)       Past Surgical History:  Procedure Laterality Date   HERNIA REPAIR      Social  History:  Ambulatory   walker      reports that she has never smoked. She has never used smokeless tobacco. She reports that she does not drink alcohol and does not use drugs.     Family History:   Family History  Problem Relation Age of Onset   Hypertension Other    ______________________________________________________________________________________________ Allergies: Allergies  Allergen Reactions   Penicillin G Itching and Rash   Penicillins Hives and Itching    Did it involve swelling of the face/tongue/throat, SOB, or low BP? No Did it involve sudden or severe rash/hives, skin peeling, or any reaction on the inside of your mouth or nose? Yes Did you need to seek medical attention at a hospital or doctor's office? Unknown When did it last happen?      Unknown (on MAR) If all above answers are "NO", may proceed with cephalosporin use.      Prior to Admission medications   Medication Sig Start Date End Date Taking? Authorizing Provider  acetaminophen (TYLENOL) 500 MG tablet Take 1,000 mg by mouth daily.    [provider]  ascorbic acid (VITAMIN C) 500 MG tablet Take 1,000 mg by mouth daily.    [provider]  brimonidine (ALPHAGAN P) 0.1 % SOLN Place 1 drop into both eyes 2 (two) times daily.     [provider]  Cholecalciferol (VITAMIN D3) 50 MCG (2000 UT) TABS Take 50 mcg by mouth daily.    [provider]  diltiazem (TIAZAC) 180 MG 24 hr capsule Take 180 mg by mouth daily.     [provider]  dorzolamide-timolol (COSOPT) 22.3-6.8 MG/ML ophthalmic solution Place 1 drop into both eyes 2 (two) times daily.    [provider]  feeding supplement, ENSURE ENLIVE, (ENSURE ENLIVE) LIQD Take 237 mLs by mouth 2 (two) times daily between meals. 03/17/20   Pokhrel, Rebekah Chesterfield, MD  lisinopril (ZESTRIL) 2.5 MG tablet Take 2.5 mg by mouth daily.    [provider]  magnesium oxide (MAG-OX) 400 MG tablet Take 400 mg by mouth at  bedtime.     [provider]  ondansetron (ZOFRAN) 4 MG tablet Take 1 tablet (4 mg total) by mouth every 6 (six) hours as needed for nausea. 03/17/20   Pokhrel, Rebekah Chesterfield, MD  polyethylene glycol (MIRALAX / GLYCOLAX) 17 g packet Take 17 g by mouth daily as needed for mild constipation or moderate constipation. 03/17/20   Pokhrel, Rebekah Chesterfield, MD  rivaroxaban (XARELTO) 20 MG TABS tablet Take 1 tablet (20 mg total) by mouth daily with supper. 03/17/20   Joycelyn Das, MD  saxagliptin  HCl (ONGLYZA) 5 MG TABS tablet Take 5 mg by mouth daily.  06/19/16   [provider]  sennosides-docusate sodium (SENOKOT-S) 8.6-50 MG tablet Take 2 tablets by mouth 2 (two) times daily as needed for constipation.    [provider]  simvastatin (ZOCOR) 10 MG tablet Take 10 mg by mouth at bedtime.    [provider]  solifenacin (VESICARE) 10 MG tablet Take 10 mg by mouth daily.  06/19/16   [provider]  zinc gluconate 50 MG tablet Take 50 mg by mouth daily.    [provider]    ___________________________________________________________________________________________________ Physical Exam:    08/07/2023    7:27 PM 08/07/2023    3:15 PM 08/07/2023    3:14 PM  Vitals with BMI  Height   5\' 5"   Weight   125 lbs 11 oz  BMI   20.91  Systolic 154 121   Diastolic 60 58   Pulse 57 57      1. General:  in No  Acute distress   Chronically ill   -appearing 2. Psychological: Alert and   Oriented 3. Head/ENT:    Dry Mucous Membranes                          Head Non traumatic, neck supple                          Poor Dentition 4. SKIN: n decreased Skin turgor,  Skin clean Dry and intact no rash    5. Heart: Regular rate and rhythm no  Murmur, no Rub or gallop 6. Lungs:  no wheezes or crackles   7. Abdomen: Soft,  non-tender, Non distended   obese  bowel sounds present 8. Lower extremities: no clubbing, cyanosis, no  edema 9. Neurologically Grossly intact, moving all 4  extremities equally   10. MSK: Normal range of motion    Chart has been reviewed  ______________________________________________________________________________________________  Assessment/Plan  89 y.o. female with medical history significant of DM2, prior small bowel obstruction, aortic insufficiency, diastolic CHF, peripheral arterial disease, HTN HLD history of PE and DVT, CKD, glaucoma, constipation, dementia  Admitted for   Dehydration   Aki amd acute encephalopathy   Present on Admission:  Hx of pulmonary embolus  AKI (acute kidney injury) (HCC)  Chronic constipation  Essential hypertension  CKD (chronic kidney disease), stage III (HCC)  Dementia (HCC)  Acute metabolic encephalopathy  Elevated troponin     Type II or unspecified type diabetes mellitus without mention of complication, not stated as uncontrolled  - Order Sensitive SSI    -  check TSH and HgA1C  - Hold by mouth medications    Hx of pulmonary embolus Continue Xarelto  AKI (acute kidney injury) (HCC) Obtain electrolytes rehydrate and follow fluid status  Chronic constipation No evidence of significant stool burden but does seem to have significant gas distention which could have been contributing to her symptoms  Essential hypertension Chronic stable hold lisinopril given AKI  CKD (chronic kidney disease), stage III (HCC) Acute on worsening CKD in the setting of dehydration will rehydrate and follow  Dementia (HCC) Note increased confusion in the setting of AKI Monitor for any signs of sundowning  Acute metabolic encephalopathy   - most likely multifactorial secondary to combination of dehydration secondary to decreased by mouth intake,    - Will rehydrate   - look for any underlining infection   -  Hold contributing medications   - if no improvement may need further imaging to evaluate for CNS pathology pathology such as MRI of the brain   - neurological exam appears to be nonfocal but  patient unable to cooperate fully   - VBG orderded   - no history of liver disease ammonia unremarkable   Elevated troponin Mildly elevated patient has history of slightly elevated troponins in the past we will continue to monitor Patient not endorsing any chest pain although originally she was calling her daughter stating she is dying without any particular complaints.  Currently appears to be much more alert and oriented Check echogram in the morning Cycle cardiac enzymes obtain serial EKG    Other plan as per orders.  DVT prophylaxis:  xarelto  Code Status:    Code Status: Prior FULL COD as per patient   I had personally discussed CODE STATUS with patient   ACP   has been reviewed     Family Communication:   Family not at  Bedside    Diet daibetic   Disposition Plan:      To home once workup is complete and patient is stable   Following barriers for discharge:                        Mentqal status improves, AKI resolves       Consult Orders  (From admission, onward)           Start     Ordered   08/07/23 1944  Consult to hospitalist  Once       Provider:  (Not yet assigned)  Question Answer Comment  Place call to: [facility]   Reason for Consult Admit      08/07/23 1943                               Would benefit from PT/OT eval prior to DC  Ordered                   Consults called: none   Admission status:  ED Disposition     ED Disposition  Admit   Condition  --   Comment  The patient appears reasonably stabilized for admission considering the current resources, flow, and capabilities available in the ED at this time, and I doubt any other [facility] requiring further screening and/or treatment in the ED prior to admission is  present.           Obs    Level of care     tele  For 12H     Azim Gillingham 08/07/2023, 10:48 PM    [facility]     after 2 AM please page floor coverage PA If 7AM-7PM, please contact the  day team taking care of the patient using [email or web address]

## 2023-08-07 NOTE — ED Notes (Signed)
Date and time results received: 08/07/23 11:46 PM  (use smartphrase ".now" to insert current time)  Test: Po2  Critical Value: <31  Name of Provider Notified: Doutova  Orders Received? Or Actions Taken?:  See chart

## 2023-08-07 NOTE — ED Notes (Signed)
ED TO INPATIENT HANDOFF REPORT  S Name/Age/Gender Kathryn Bradford 89 y.o. female Room/Bed: WA22/WA22  Code Status   Code Status: Full Code  Home/SNF/Other Home Patient oriented to: self, place, time, and situation Is this baseline? Yes   Triage Complete: Triage complete  Chief Complaint AKI (acute kidney injury) (HCC) [N17.9]  Triage Note Patient BIB [facility] from home. Patients daughter called because patient called her saying she was dying. Is more confused than normal, having trouble managing medications, feels constipated. Has dementia but lives alone. Not eating/drinking well.    Allergies Allergies  Allergen Reactions   Penicillins Hives, Itching, Rash and Other (See Comments)    Did it involve swelling of the face/tongue/throat, SOB, or low BP? No Did it involve sudden or severe rash/hives, skin peeling, or any reaction on the inside of your mouth or nose? Yes Did you need to seek medical attention at a hospital or doctor's office? Unknown When did it last happen?      Unknown (on MAR) If all above answers are "NO", may proceed with cephalosporin use.     Level of Care/Admitting Diagnosis ED Disposition     ED Disposition  Admit   Condition  --   Comment  Hospital Area: [facility] [[id number]]  Level of Care: Telemetry [5]  Admit to tele based on following criteria: Other see comments  Comments: aki  May place patient in observation at [facility] or [facility] if equivalent level of care is available:: No  Covid Evaluation: Asymptomatic - no recent exposure (last 10 days) testing not required  Diagnosis: AKI (acute kidney injury) [facility]) [[id number]]  Admitting Physician: Therisa Doyne [[id number]]  Attending Physician: Therisa Doyne [[id number]]          B Medical/Surgery History Past Medical History:  Diagnosis Date   Bowel obstruction (HCC)    Diabetes mellitus without complication (HCC)    Diverticulitis    Glaucoma    Hernia     Hypertension    Pulmonary emboli (HCC)    Past Surgical History:  Procedure Laterality Date   HERNIA REPAIR       A IV Location/Drains/Wounds Patient Lines/Drains/Airways Status     Active Line/Drains/Airways     Name Placement date Placement time Site Days   Peripheral IV 08/07/23 20 G Right Antecubital 08/07/23  1640  Antecubital  less than 1   Midline Single Lumen 03/16/20 Midline Left Basilic 8 cm 0 cm 03/16/20  2245  --  1239   External Urinary Catheter 03/16/20  2119  --  1239            Intake/Output Last 24 hours  Intake/Output Summary (Last 24 hours) at 08/07/2023 2341 Last data filed at 08/07/2023 2227 Gross per 24 hour  Intake 1000 ml  Output --  Net 1000 ml    Labs/Imaging Results for orders placed or performed during the hospital encounter of 08/07/23 (from the past 48 hour(s))  Comprehensive metabolic panel     Status: Abnormal   Collection Time: 08/07/23  4:45 PM  Result Value Ref Range   Sodium 141 135 - 145 mmol/L   Potassium 4.3 3.5 - 5.1 mmol/L   Chloride 106 98 - 111 mmol/L   CO2 26 22 - 32 mmol/L   Glucose, Bld 105 (H) 70 - 99 mg/dL    Comment: Glucose reference range applies only to samples taken after fasting for at least 8 hours.   BUN 35 (H) 8 - 23 mg/dL  Creatinine, Ser 1.55 (H) 0.44 - 1.00 mg/dL   Calcium 81.1 (H) 8.9 - 10.3 mg/dL   Total Protein 8.0 6.5 - 8.1 g/dL   Albumin 3.3 (L) 3.5 - 5.0 g/dL   AST 19 15 - 41 U/L   ALT 11 0 - 44 U/L   Alkaline Phosphatase 49 38 - 126 U/L   Total Bilirubin 0.2 (L) 0.3 - 1.2 mg/dL   GFR, Estimated 31 (L) >60 mL/min    Comment: (NOTE) Calculated using the CKD-EPI Creatinine Equation (2021)    Anion gap 9 5 - 15    Comment: Performed at [facility], [address] [place], [place] [address]  CBC with Differential     Status: Abnormal   Collection Time: 08/07/23  4:45 PM  Result Value Ref Range   WBC 4.9 4.0 - 10.5 K/uL   RBC 3.41 (L) 3.87 - 5.11 MIL/uL   Hemoglobin 10.8  (L) 12.0 - 15.0 g/dL   HCT 29.5 (L) 62.1 - 30.8 %   MCV 96.8 80.0 - 100.0 fL   MCH 31.7 26.0 - 34.0 pg   MCHC 32.7 30.0 - 36.0 g/dL   RDW 65.7 84.6 - 96.2 %   Platelets 190 150 - 400 K/uL   nRBC 0.0 0.0 - 0.2 %   Neutrophils Relative % 70 %   Neutro Abs 3.5 1.7 - 7.7 K/uL   Lymphocytes Relative 18 %   Lymphs Abs 0.9 0.7 - 4.0 K/uL   Monocytes Relative 9 %   Monocytes Absolute 0.4 0.1 - 1.0 K/uL   Eosinophils Relative 2 %   Eosinophils Absolute 0.1 0.0 - 0.5 K/uL   Basophils Relative 1 %   Basophils Absolute 0.0 0.0 - 0.1 K/uL   Immature Granulocytes 0 %   Abs Immature Granulocytes 0.01 0.00 - 0.07 K/uL    Comment: Performed at [facility], [address] [place], [place] [address]  Lipase, blood     Status: None   Collection Time: 08/07/23  4:45 PM  Result Value Ref Range   Lipase 36 11 - 51 U/L    Comment: Performed at [facility], [address] [place], [place] [address]  TSH     Status: None   Collection Time: 08/07/23  4:45 PM  Result Value Ref Range   TSH 0.985 0.350 - 4.500 uIU/mL    Comment: Performed by a 3rd Generation assay with a functional sensitivity of <=0.01 uIU/mL. Performed at [facility], [address], [place], [place] [address]   Ammonia     Status: None   Collection Time: 08/07/23  4:45 PM  Result Value Ref Range   Ammonia 18 9 - 35 umol/L    Comment: Performed at [facility], [address], [place], [place] [address]  Hemoglobin A1c     Status: Abnormal   Collection Time: 08/07/23  4:45 PM  Result Value Ref Range   Hgb A1c MFr Bld 6.8 (H) 4.8 - 5.6 %    Comment: (NOTE) Pre diabetes:          5.7%-6.4%  Diabetes:              >6.4%  Glycemic control for   <7.0% adults with diabetes    Mean Plasma Glucose 148.46 mg/dL    Comment: Performed at [facility], [address] [place], [place] [address]  Reticulocytes     Status: Abnormal   Collection Time:  08/07/23  4:45 PM  Result Value Ref Range   Retic Ct Pct 1.3 0.4 - 3.1 %   RBC. 3.43 (L) 3.87 - 5.11 MIL/uL   Retic Count, Absolute 44.9 19.0 - 186.0 K/uL   Immature Retic Fract 13.2 2.3 - 15.9 %    Comment: Performed at [facility], [address], [place], [place] [address]  Urinalysis, w/ Reflex to Culture (Infection Suspected) -Urine, Clean Catch     Status: None   Collection Time: 08/07/23  4:46 PM  Result Value Ref Range   Specimen Source URINE, CLEAN CATCH    Color, Urine YELLOW YELLOW   APPearance CLEAR CLEAR   Specific Gravity, Urine 1.015 1.005 - 1.030   pH 6.0 5.0 - 8.0   Glucose, UA NEGATIVE NEGATIVE mg/dL   Hgb urine dipstick NEGATIVE NEGATIVE   Bilirubin Urine NEGATIVE NEGATIVE   Ketones, ur NEGATIVE NEGATIVE mg/dL   Protein, ur NEGATIVE NEGATIVE mg/dL   Nitrite NEGATIVE NEGATIVE   Leukocytes,Ua NEGATIVE NEGATIVE   RBC / HPF 0-5 0 - 5 RBC/hpf   WBC, UA 0-5 0 - 5 WBC/hpf    Comment:        Reflex urine culture not performed if WBC <=10, OR if Squamous epithelial cells >5. If Squamous epithelial cells >5 suggest recollection.    Bacteria, UA NONE SEEN NONE SEEN   Squamous Epithelial / HPF 0-5 0 - 5 /HPF   Mucus PRESENT     Comment: Performed at [facility], [address], [place], [place] [address]  Creatinine, urine, random     Status: None   Collection Time: 08/07/23  8:37 PM  Result Value Ref Range   Creatinine, Urine 155 mg/dL    Comment: Performed at [facility], [address] [place], [place] [address]  Sodium, urine, random     Status: None   Collection Time: 08/07/23  8:37 PM  Result Value Ref Range   Sodium, Ur 43 mmol/L    Comment: Performed at [facility], [address], [place], [place] [address]  CBG monitoring, ED     Status: Abnormal   Collection Time: 08/07/23 11:12 PM  Result Value Ref Range   Glucose-Capillary 148 (H) 70 - 99 mg/dL    Comment: Glucose  reference range applies only to samples taken after fasting for at least 8 hours.   CT ABDOMEN PELVIS WO CONTRAST  Result Date: 08/07/2023 CLINICAL DATA:  Acute nonlocalized abdominal pain. Feels constipated and not eating or drinking well. Dementia with increased confusion. EXAM: CT ABDOMEN AND PELVIS WITHOUT CONTRAST TECHNIQUE: Multidetector CT imaging of the abdomen and pelvis was performed following the standard protocol without IV contrast. RADIATION DOSE REDUCTION: This exam was performed according to the departmental dose-optimization program which includes automated exposure control, adjustment of the mA and/or kV according to patient size and/or use of iterative reconstruction technique. COMPARISON:  05/01/2023 FINDINGS: Lower chest: Scarring and bronchitic changes in the lung bases. Hepatobiliary: No focal liver lesions. Cholelithiasis with small stones layering in the gallbladder. No inflammatory changes. No bile duct dilatation. Pancreas: Unremarkable. No pancreatic ductal dilatation or surrounding inflammatory changes. Spleen: Normal in size without focal abnormality. Adrenals/Urinary Tract: No adrenal gland nodules. Diffuse renal parenchymal atrophy bilaterally. No hydronephrosis or hydroureter. Bilateral renal cysts. Representative cyst on the right lower pole measures 5.6 cm in diameter. No change since prior studies. No imaging follow-up is indicated. Bladder is normal. No renal, ureteral, or bladder stones. Stomach/Bowel:  Stomach, small bowel, and colon are not abnormally distended. No wall thickening or inflammatory changes are appreciated. Postoperative right hemicolectomy with ileocolonic anastomosis. Ventral abdominal wall hernia containing small bowel and colon without evidence of proximal obstruction. Colonic diverticulosis without evidence of acute diverticulitis. Vascular/Lymphatic: Prominent calcification of the aorta and major branch vessels. No aneurysm. No significant  lymphadenopathy. Reproductive: Status post hysterectomy. No adnexal masses. Other: No free air or free fluid in the abdomen. Fatty atrophy of the abdominal and hip musculature. Musculoskeletal: Degenerative changes in the spine and hips. No acute bony abnormalities. IMPRESSION: 1. Scarring and bronchitic changes in the lung bases. 2. Cholelithiasis without evidence of acute cholecystitis. 3. No evidence of bowel obstruction or inflammation. Ventral abdominal wall hernia contains small bowel and colon without evidence of proximal obstruction. 4. Prominent aortic atherosclerosis. 5. Colonic diverticulosis without evidence of acute diverticulitis. Electronically Signed   By: Burman Nieves M.D.   On: 08/07/2023 19:38   CT Head Wo Contrast  Result Date: 08/07/2023 CLINICAL DATA:  Minor head trauma. Dementia with increased confusion. EXAM: CT HEAD WITHOUT CONTRAST TECHNIQUE: Contiguous axial images were obtained from the base of the skull through the vertex without intravenous contrast. RADIATION DOSE REDUCTION: This exam was performed according to the departmental dose-optimization program which includes automated exposure control, adjustment of the mA and/or kV according to patient size and/or use of iterative reconstruction technique. COMPARISON:  MRI brain 07/17/2018.  CT head 03/13/2020 FINDINGS: Brain: Diffuse cerebral atrophy. Ventricular dilatation consistent with central atrophy. Low-attenuation changes in the deep white matter consistent with small vessel ischemia. No abnormal extra-axial fluid collections. No mass effect or midline shift. Gray-white matter junctions are distinct. Basal cisterns are not effaced. No acute intracranial hemorrhage. Vascular: No hyperdense vessel or unexpected calcification. Skull: Normal. Negative for fracture or focal lesion. Sinuses/Orbits: No acute finding. Other: None. IMPRESSION: No acute intracranial abnormalities. Chronic atrophy and small vessel ischemic changes.  Electronically Signed   By: Burman Nieves M.D.   On: 08/07/2023 19:30   DG Chest Port 1 View  Result Date: 08/07/2023 CLINICAL DATA:  Weakness. EXAM: PORTABLE CHEST 1 VIEW COMPARISON:  Apr 30, 2023. FINDINGS: Stable cardiomediastinal silhouette. Both lungs are clear. The visualized skeletal structures are unremarkable. IMPRESSION: No active disease. Electronically Signed   By: Lupita Raider M.D.   On: 08/07/2023 16:50    Pending Labs Unresulted Labs (From admission, onward)     Start     Ordered   08/08/23 0500  Vitamin B12  (Anemia Panel (PNL))  Tomorrow morning,   R        08/07/23 2024   08/08/23 0500  Folate  (Anemia Panel (PNL))  Tomorrow morning,   R        08/07/23 2024   08/08/23 0500  Prealbumin  Tomorrow morning,   R        08/07/23 2024   08/07/23 2029  Blood gas, venous  ONCE - STAT,   R       Question:  Release to patient  Answer:  Immediate   08/07/23 2028   08/07/23 2025  Iron and TIBC  (Anemia Panel (PNL))  Add-on,   AD        08/07/23 2024   08/07/23 2025  Ferritin  (Anemia Panel (PNL))  Add-on,   AD        08/07/23 2024   08/07/23 2025  Vitamin B1  Add-on,   AD        08/07/23 2024  08/07/23 2025  Procalcitonin  Add-on,   AD       References:    Procalcitonin Lower Respiratory Tract Infection AND Sepsis Procalcitonin Algorithm   08/07/23 2024   08/07/23 2025  CK  Add-on,   AD        08/07/23 2024   08/07/23 2025  Magnesium  Add-on,   AD        08/07/23 2024   08/07/23 2025  Phosphorus  Add-on,   AD        08/07/23 2024   08/07/23 2025  Osmolality, urine  Once,   URGENT        08/07/23 2024   08/07/23 2025  Osmolality  Add-on,   AD        08/07/23 2024   08/07/23 2024  Resp panel by RT-PCR (RSV, Flu A&B, Covid) Anterior Nasal Swab  Once,   URGENT        08/07/23 2023   Signed and Held  Magnesium  Tomorrow morning,   R        Signed and Held   Signed and Held  Phosphorus  Tomorrow morning,   R        Signed and Held   Signed and Held  Comprehensive  metabolic panel  Tomorrow morning,   R       Question:  Release to patient  Answer:  Immediate   Signed and Held   Signed and Held  CBC  Tomorrow morning,   R       Question:  Release to patient  Answer:  Immediate   Signed and Held            Vitals/Pain Today's Vitals   08/07/23 1514 08/07/23 1515 08/07/23 1927 08/07/23 2317  BP:  (!) 121/58 (!) 154/60   Pulse:  (!) 57 (!) 57   Resp:  15 16   Temp:  98.1 F (36.7 C) 97.8 F (36.6 C) 97.8 F (36.6 C)  TempSrc:   Oral Oral  SpO2:  96% 97%   Weight: 57 kg     Height: 5\' 5"  (1.651 m)     PainSc:  0-No pain      Isolation Precautions No active isolations  Medications Medications  insulin aspart (novoLOG) injection 0-9 Units (1 Units Subcutaneous Given 08/07/23 2337)  sodium chloride 0.9 % bolus 1,000 mL (0 mLs Intravenous Stopped 08/07/23 2227)    Mobility walks with device     R Report given to: [employer]

## 2023-08-07 NOTE — Assessment & Plan Note (Signed)
Chronic stable hold lisinopril given AKI

## 2023-08-07 NOTE — ED Triage Notes (Signed)
Patient BIB [facility] from home. Patients daughter called because patient called her saying she was dying. Is more confused than normal, having trouble managing medications, feels constipated. Has dementia but lives alone. Not eating/drinking well.

## 2023-08-07 NOTE — Assessment & Plan Note (Signed)
-   Order Sensitive  SSI     -  check TSH and HgA1C  - Hold by mouth medications*  

## 2023-08-08 ENCOUNTER — Observation Stay (HOSPITAL_COMMUNITY): Payer: 59

## 2023-08-08 DIAGNOSIS — N179 Acute kidney failure, unspecified: Secondary | ICD-10-CM | POA: Diagnosis not present

## 2023-08-08 LAB — CBC
HCT: 32.5 % — ABNORMAL LOW (ref 36.0–46.0)
Hemoglobin: 10.4 g/dL — ABNORMAL LOW (ref 12.0–15.0)
MCH: 31 pg (ref 26.0–34.0)
MCHC: 32 g/dL (ref 30.0–36.0)
MCV: 97 fL (ref 80.0–100.0)
Platelets: 183 10*3/uL (ref 150–400)
RBC: 3.35 MIL/uL — ABNORMAL LOW (ref 3.87–5.11)
RDW: 15 % (ref 11.5–15.5)
WBC: 4.1 10*3/uL (ref 4.0–10.5)
nRBC: 0 % (ref 0.0–0.2)

## 2023-08-08 LAB — COMPREHENSIVE METABOLIC PANEL
ALT: 10 U/L (ref 0–44)
AST: 19 U/L (ref 15–41)
Albumin: 3 g/dL — ABNORMAL LOW (ref 3.5–5.0)
Alkaline Phosphatase: 44 U/L (ref 38–126)
Anion gap: 5 (ref 5–15)
BUN: 26 mg/dL — ABNORMAL HIGH (ref 8–23)
CO2: 25 mmol/L (ref 22–32)
Calcium: 9.9 mg/dL (ref 8.9–10.3)
Chloride: 110 mmol/L (ref 98–111)
Creatinine, Ser: 1.32 mg/dL — ABNORMAL HIGH (ref 0.44–1.00)
GFR, Estimated: 38 mL/min — ABNORMAL LOW (ref >60)
Glucose, Bld: 163 mg/dL — ABNORMAL HIGH (ref 70–99)
Potassium: 3.7 mmol/L (ref 3.5–5.1)
Sodium: 140 mmol/L (ref 135–145)
Total Bilirubin: 0.3 mg/dL (ref 0.3–1.2)
Total Protein: 7.4 g/dL (ref 6.5–8.1)

## 2023-08-08 LAB — GLUCOSE, CAPILLARY
Glucose-Capillary: 100 mg/dL — ABNORMAL HIGH (ref 70–99)
Glucose-Capillary: 91 mg/dL (ref 70–99)
Glucose-Capillary: 158 mg/dL — ABNORMAL HIGH (ref 70–99)
Glucose-Capillary: 84 mg/dL (ref 70–99)
Glucose-Capillary: 197 mg/dL — ABNORMAL HIGH (ref 70–99)
Glucose-Capillary: 197 mg/dL — ABNORMAL HIGH (ref 70–99)

## 2023-08-08 LAB — OSMOLALITY: Osmolality: 309 mosm/kg — ABNORMAL HIGH (ref 275–295)

## 2023-08-08 LAB — RESP PANEL BY RT-PCR (RSV, FLU A&B, COVID)  RVPGX2
Influenza A by PCR: NEGATIVE
Influenza B by PCR: NEGATIVE
Resp Syncytial Virus by PCR: NEGATIVE
SARS Coronavirus 2 by RT PCR: NEGATIVE

## 2023-08-08 LAB — FOLATE: Folate: 11.4 ng/mL (ref >5.9)

## 2023-08-08 LAB — FERRITIN: Ferritin: 43 ng/mL (ref 11–307)

## 2023-08-08 LAB — TROPONIN I (HIGH SENSITIVITY)
Troponin I (High Sensitivity): 45 ng/L — ABNORMAL HIGH (ref <18)
Troponin I (High Sensitivity): 46 ng/L — ABNORMAL HIGH (ref <18)
Troponin I (High Sensitivity): 47 ng/L — ABNORMAL HIGH (ref <18)

## 2023-08-08 LAB — IRON AND TIBC
Iron: 51 ug/dL (ref 28–170)
Saturation Ratios: 20 % (ref 10.4–31.8)
TIBC: 262 ug/dL (ref 250–450)
UIBC: 211 ug/dL

## 2023-08-08 LAB — VITAMIN B12: Vitamin B-12: 923 pg/mL — ABNORMAL HIGH (ref 180–914)

## 2023-08-08 LAB — MAGNESIUM
Magnesium: 2.1 mg/dL (ref 1.7–2.4)
Magnesium: 2.3 mg/dL (ref 1.7–2.4)

## 2023-08-08 LAB — OSMOLALITY, URINE: Osmolality, Ur: 467 mosm/kg (ref 300–900)

## 2023-08-08 LAB — PROCALCITONIN: Procalcitonin: <0.1 ng/mL

## 2023-08-08 LAB — PHOSPHORUS
Phosphorus: 2.6 mg/dL (ref 2.5–4.6)
Phosphorus: 2.9 mg/dL (ref 2.5–4.6)

## 2023-08-08 LAB — CK: Total CK: 43 U/L (ref 38–234)

## 2023-08-08 LAB — PREALBUMIN: Prealbumin: 16 mg/dL — ABNORMAL LOW (ref 18–38)

## 2023-08-08 MED ORDER — DILTIAZEM HCL 30 MG PO TABS
30.0000 mg | ORAL_TABLET | Freq: Four times a day (QID) | ORAL | Status: DC
  Administered 2023-08-08 – 2023-08-10 (×9): 30 mg via ORAL
  Filled 2023-08-08 (×9): qty 1

## 2023-08-08 MED ORDER — SODIUM CHLORIDE 0.9 % IV SOLN: INTRAVENOUS | Status: AC

## 2023-08-08 MED ORDER — ENSURE ENLIVE PO LIQD
237.0000 mL | Freq: Two times a day (BID) | ORAL | Status: DC
  Administered 2023-08-08 – 2023-08-14 (×9): 237 mL via ORAL

## 2023-08-08 NOTE — Evaluation (Signed)
Occupational Therapy Evaluation Patient Details Name: Kathryn Bradford MRN: [id number] DOB: 1930/02/27 Today's Date: 08/08/2023   History of Present Illness 89 yo female presents to therapy following hospital admission on 08/07/2023 secondary to AMS, progressive weakness, abdominal pain and poor PO intake. Pt found to have mild AKI.  Pt PMH includes but is not limited to: SM II, SBO, aortic insufficiency, dCHF, peripheral arterial dz, HTN, HLD, PE, DVT, glaucoma, constipation and dementia.   Clinical Impression   Pt baseline pt lives alone independently. Pt states her daughter "calls her" everyday. Currently Kathryn Bradford requires  CGA to S with ADL and mobility @ RW level. Pt has dementia at baseline however scored a 16 on the Short Blessed Test, which indicated significant cognitive impairment. Recommend increased level of assistance from family for safe DC home in addition to [facility] and a HH Aide. If family assistance is limited, recommend HHSW to assess living situation. Acute OT to follow.       If plan is discharge home, recommend the following: A little help with bathing/dressing/bathroom;Assistance with cooking/housework;Direct supervision/assist for medications management;Direct supervision/assist for financial management;Assist for transportation;Supervision due to cognitive status    Functional Status Assessment  Patient has had a recent decline in their functional status and demonstrates the ability to make significant improvements in function in a reasonable and predictable amount of time.  Equipment Recommendations  None recommended by OT    Recommendations for Other Services       Precautions / Restrictions Precautions Precautions: Fall Restrictions Weight Bearing Restrictions: No      Mobility Bed Mobility Overal bed mobility: Needs Assistance Bed Mobility: Supine to Sit     Supine to sit: Contact guard, HOB elevated     General bed mobility comments: min cues     Transfers Overall transfer level: Needs assistance Equipment used: Rolling walker (2 wheels) Transfers: Sit to/from Stand Sit to Stand: Contact guard assist, From elevated surface                  Balance Overall balance assessment: Needs assistance Sitting-balance support: Feet supported Sitting balance-Leahy Scale: Good     Standing balance support: Bilateral upper extremity supported, During functional activity, Reliant on assistive device for balance Standing balance-Leahy Scale: Fair                             ADL either performed or assessed with clinical judgement   ADL Overall ADL's : Needs assistance/impaired Eating/Feeding: Modified independent   Grooming: Supervision/safety Grooming Details (indicate cue type and reason): cues to wash hands after using the bathroom Upper Body Bathing: Set up   Lower Body Bathing: Contact guard assist   Upper Body Dressing : Set up   Lower Body Dressing: Contact guard assist   Toilet Transfer: Supervision/safety   Toileting- Clothing Manipulation and Hygiene: Supervision/safety       Functional mobility during ADLs: Supervision/safety       Vision Baseline Vision/History: 3 Glaucoma       Perception         Praxis         Pertinent Vitals/Pain Pain Assessment Pain Assessment: Faces Faces Pain Scale: Hurts little more Pain Location: L knee Pain Descriptors / Indicators: Constant, Discomfort, Grimacing, Guarding Pain Intervention(s): Limited activity within patient's tolerance     Extremity/Trunk Assessment Upper Extremity Assessment Upper Extremity Assessment: Generalized weakness   Lower Extremity Assessment Lower Extremity Assessment: Generalized weakness   Cervical /  Trunk Assessment Cervical / Trunk Assessment: Kyphotic   Communication Communication Communication: No apparent difficulties   Cognition Arousal: Alert Behavior During Therapy: WFL for tasks  assessed/performed Overall Cognitive Status: No family/caregiver present to determine baseline cognitive functioning                                 General Comments: Assessed wtih the Short Blessed Test - score of 16; errors with counting backwards; months in reverse; 1/5 on delayed recall     General Comments  L medial aspect of knee noted edema and pain to light touch    Exercises     Shoulder Instructions      Home Living Family/patient expects to be discharged to:: Private residence Living Arrangements: Alone Available Help at Discharge: Family;Other (Comment) (caregiver assist per pt report) Type of Home: Apartment Home Access: Level entry     Home Layout: One level     Bathroom Shower/Tub: Chief Strategy Officer: Standard Bathroom Accessibility: Yes How Accessible: Accessible via walker Home Equipment: Rolling Walker (2 wheels);Wheelchair - manual;Shower seat;BSC/3in1   Additional Comments: pt is a poor historian and no family present to provide insight to PLOF      Prior Functioning/Environment Prior Level of Function : Needs assist;Patient poor historian/Family not available  Cognitive Assist : Mobility (cognitive);ADLs (cognitive) Mobility (Cognitive): Intermittent cues ADLs (Cognitive): Intermittent cues Physical Assist : Mobility (physical);ADLs (physical) Mobility (physical): Stairs;Gait ADLs (physical): IADLs Mobility Comments: pt was unable to relay if amb with RW PLOF. ADLs Comments: pt indicated her daughter helps with groceries and provides some meals, pt states she does not cook and has caregiver assist sometimes but her daughter is unable to physically help her in home setting        OT Problem List: Decreased strength;Decreased activity tolerance;Impaired balance (sitting and/or standing);Impaired vision/perception;Decreased cognition;Decreased safety awareness      OT Treatment/Interventions:      OT Goals(Current  goals can be found in the care plan section) Acute Rehab OT Goals Patient Stated Goal: to go home OT Goal Formulation: With patient Time For Goal Achievement: 08/22/23 Potential to Achieve Goals: Good  OT Frequency: Min 1X/week    Co-evaluation              AM-PAC OT "6 Clicks" Daily Activity     Outcome Measure Help from another person eating meals?: None Help from another person taking care of personal grooming?: A Little Help from another person toileting, which includes using toliet, bedpan, or urinal?: A Little Help from another person bathing (including washing, rinsing, drying)?: A Little Help from another person to put on and taking off regular upper body clothing?: A Little Help from another person to put on and taking off regular lower body clothing?: A Little 6 Click Score: 19   End of Session Equipment Utilized During Treatment: Gait belt;Rolling walker (2 wheels) Nurse Communication: Mobility status  Activity Tolerance: Patient tolerated treatment well Patient left: in chair;with call bell/phone within reach;with chair alarm set  OT Visit Diagnosis: Unsteadiness on feet (R26.81);Other abnormalities of gait and mobility (R26.89);Muscle weakness (generalized) (M62.81);Other symptoms and signs involving cognitive function                Time: 1610-9604 OT Time Calculation (min): 25 min Charges:  OT General Charges $OT Visit: 1 Visit OT Evaluation $OT Eval Moderate Complexity: 1 Mod OT Treatments $Self Care/Home Management : 8-22 mins  Luisa Dago, OT/L   Acute OT Clinical Specialist Acute Rehabilitation Services Pager [phone] Office [phone]   [facility] 08/08/2023, 2:09 PM

## 2023-08-08 NOTE — Progress Notes (Signed)
PROGRESS NOTE  Kathryn Bradford  DOB: [birth year]  PCP: Sharmon Revere, MD AOZ:[id number]  DOA: 08/07/2023  LOS: 0 days  Hospital Day: 2  Brief narrative: Kathryn Bradford is a 89 y.o. female with PMH significant for dementia, DM2, HTN, HLD, aortic insufficiency, diastolic CHF, PAD, PE on Eliquis, CKD, constipation, diverticulitis, hernia, prior bowel obstruction who lives alone. 8/29, patient was brought to the ED by EMS from home for worsening confusion, constipation, generalized weakness, poor intake and hydration.  In the ED, patient was afebrile, bradycardic to 50s, blood pressure stable Labs with WBC count normal, hemoglobin 10.8 Patient/creatinine elevated 35/1.55 Troponin mildly elevated to 40s Procalcitonin level normal, TSH normal Resp virus panel unremarkable Urine is normal CT head showed chronic small vessel changes, no acute abnormality CT abdomen pelvis showed  1. Scarring and bronchitic changes in the lung bases. 2. Cholelithiasis without evidence of acute cholecystitis. 3. No evidence of bowel obstruction or inflammation. Ventral abdominal wall hernia contains small bowel and colon without evidence of proximal obstruction. 4. Prominent aortic atherosclerosis. 5. Colonic diverticulosis without evidence of acute diverticulitis.  Admitted to [facility]  Subjective: Patient was seen and examined this morning. Elderly African-American female.  Propped up in bed.  Not in distress.  Awake but able to have conversation and oriented x 3. Remains afebrile and bradycardic to 50s Repeat labs this morning showed improvement in creatinine. I tried to call her daughter at bedside but no success.  Assessment and plan: AKI on CKD stage IIIa Presented with poor intake, dehydration, creatinine elevated to 1.5.  Per Care Everywhere, last creatinine from May 2024 was 0.96 Expect improvement in creatinine with IV fluid. Recent Labs    08/07/23 1645 08/08/23 0050  BUN 35* 26*   CREATININE 1.55* 1.32*   Acute metabolic encephalopathy Underlying dementia Worsening lethargy secondary to dehydration.  No evidence of infection.  CT head unremarkable.  No history of liver disease.  Ammonia level normal.   Neurological exam nonfocal.   Continue Aricept Continue to monitor mental status change.  Elevated troponin No complaint of chest pain.  Mildly elevated troponin likely due to dehydration. Echocardiogram pending Recent Labs    08/07/23 2317 08/08/23 0050 08/08/23 0550  CKTOTAL 43  --   --   TROPONINIHS 45* 47* 46*   H/o hypertension Sinus bradycardia PTA meds- Cardizem 180 mg daily Blood pressure normal but heart rate in 50s.  Probably needs to reduce the total daily dose of Cardizem.  Will start with Cardizem 30 mg every 6 hours. Resume Cardizem.  Continue monitor blood pressure  Type 2 diabetes mellitus A1c 6.8 on 08/07/2023 continue Not on meds PTA. Continue SSI/Accu-Cheks Recent Labs  Lab 08/07/23 2312 08/08/23 0415 08/08/23 0741  GLUCAP 148* 100* 91   H/o pulm embolism Continue Xarelto  Chronic constipation Continue bowel regimen  Mild chronic anemia Continue vitamin B12, folic acid level Recent Labs    08/07/23 1645 08/07/23 2317 08/08/23 0050  HGB 10.8*  --  10.4*  MCV 96.8  --  97.0  VITAMINB12  --   --  923*  FOLATE  --   --  11.4  FERRITIN  --  43  --   TIBC  --  262  --   IRON  --  51  --   RETICCTPCT 1.3  --   --      Mobility: Encourage ambulation.  PT eval ordered  Goals of care   Code Status: Full Code     DVT prophylaxis:  SCDs Start: 08/07/23 2355 rivaroxaban (XARELTO) tablet 20 mg   Antimicrobials: Currently none Fluid: NS at 75 mL/h Consultants: None Family Communication: Family not at bedside  Status: Observation Level of care:  Telemetry   Patient is from: Home Needs to continue in-hospital care: Needs rehydration and lab monitoring Anticipated d/c to: Hopefully home in 1 to 2  days      Diet:  Diet Order             Diet Carb Modified Fluid consistency: Thin; Room service appropriate? Yes  Diet effective now                   Scheduled Meds:  diltiazem  30 mg Oral Q6H   donepezil  5 mg Oral QPM   insulin aspart  0-9 Units Subcutaneous Q4H   latanoprost  1 drop Both Eyes QHS   polyethylene glycol  17 g Oral Daily   rivaroxaban  20 mg Oral Q supper    PRN meds: acetaminophen **OR** acetaminophen, HYDROcodone-acetaminophen, ondansetron **OR** ondansetron (ZOFRAN) IV, traMADol   Infusions:   sodium chloride      Antimicrobials: Anti-infectives (From admission, onward)    None       Nutritional status:  Body mass index is 20.91 kg/m.          Objective: Vitals:   08/08/23 0005 08/08/23 0418  BP: (!) 149/62 (!) 133/53  Pulse: (!) 56 (!) 51  Resp:  20  Temp: 98.3 F (36.8 C) 97.6 F (36.4 C)  SpO2: 100% 98%    Intake/Output Summary (Last 24 hours) at 08/08/2023 1156 Last data filed at 08/08/2023 1007 Gross per 24 hour  Intake 1663.52 ml  Output --  Net 1663.52 ml   Filed Weights   08/07/23 1514  Weight: 57 kg   Weight change:  Body mass index is 20.91 kg/m.   Physical Exam: General exam: Pleasant, elderly African-American female.  Not in physical distress Skin: No rashes, lesions or ulcers. HEENT: Atraumatic, normocephalic, no obvious bleeding Lungs: Clear to bilaterally CVS: Mild sinus bradycardia, regular rhythm, no murmur GI/Abd soft, nontender, nondistended, bowel sound present CNS: Alert, awake, oriented x 3.  Weak and slow to respond Psychiatry: Mood appropriate Extremities: No pedal edema, no calf tenderness  Data Review: I have personally reviewed the laboratory data and studies available.  F/u labs ordered Unresulted Labs (From admission, onward)     Start     Ordered   08/09/23 0500  CBC with Differential/Platelet  Daily,   R      08/08/23 1156   08/09/23 0500  Basic metabolic panel  Daily,    R      08/08/23 1156   08/07/23 2025  Vitamin B1  Add-on,   AD        08/07/23 2024            Total time spent in review of labs and imaging, patient evaluation, formulation of plan, documentation and communication with family: 45 minutes  Signed, Lorin Glass, MD [facility] 08/08/2023

## 2023-08-08 NOTE — Care Management Obs Status (Signed)
MEDICARE OBSERVATION STATUS NOTIFICATION   Patient Details  Name: Kathryn Bradford MRN: [id number] Date of Birth: 12/19/1929   Medicare Observation Status Notification Given:  Yes    Beckie Busing, RN 08/08/2023, 9:59 AM

## 2023-08-08 NOTE — Evaluation (Signed)
Physical Therapy Evaluation Patient Details Name: Kathryn Bradford MRN: [id number] DOB: 1929/12/16 Today's Date: 08/08/2023  History of Present Illness  89 yo female presents to therapy following hospital admission on 08/07/2023 secondary to AMS, progressive weakness, abdominal pain and poor PO intake. Pt found to have mild AKI.  Pt PMH includes but is not limited to: SM II, SBO, aortic insufficiency, dCHF, peripheral arterial dz, HTN, HLD, PE, DVT, glaucoma, constipation and dementia.  Clinical Impression     Pt admitted with above diagnosis.  Pt currently with functional limitations due to the deficits listed below (see PT Problem List). Pt in bed when PT arrived. Pt agreeable to therapy intervention and eager to get OOB. Pt required min cues and CGA for supine to sit, CGA and cues for sit to stand  from EOB, gait tasks in hallway with RW CGA and min cues for 55 feet. Pt left seated in recliner all needs in place. Pt has dementia at baseline and unable to provide insight to PLOF, pt will require increased supervision and support in home setting for safety.  Pt will benefit from acute skilled PT to increase their independence and safety with mobility to allow discharge.         If plan is discharge home, recommend the following: A little help with walking and/or transfers;A little help with bathing/dressing/bathroom;Assistance with cooking/housework;Help with stairs or ramp for entrance;Assist for transportation   Can travel by private vehicle        Equipment Recommendations None recommended by PT (pt reports RW in home setting)  Recommendations for Other Services       Functional Status Assessment Patient has had a recent decline in their functional status and demonstrates the ability to make significant improvements in function in a reasonable and predictable amount of time.     Precautions / Restrictions Precautions Precautions: Fall Restrictions Weight Bearing Restrictions: No       Mobility  Bed Mobility Overal bed mobility: Needs Assistance Bed Mobility: Supine to Sit     Supine to sit: Contact guard, HOB elevated     General bed mobility comments: min cues    Transfers Overall transfer level: Needs assistance Equipment used: Rolling walker (2 wheels) Transfers: Sit to/from Stand Sit to Stand: Contact guard assist, From elevated surface           General transfer comment: min cues for proper UE placement    Ambulation/Gait Ambulation/Gait assistance: Contact guard assist Gait Distance (Feet): 55 Feet Assistive device: Rolling walker (2 wheels) Gait Pattern/deviations: Trunk flexed, Decreased stance time - left, Step-to pattern Gait velocity: decreased     General Gait Details: short stride length. limited L stance time due to L knee pain, trunk flexion compounded by kyphosis  Stairs            Wheelchair Mobility     Tilt Bed    Modified Rankin (Stroke Patients Only)       Balance Overall balance assessment: Needs assistance Sitting-balance support: Feet supported Sitting balance-Leahy Scale: Good     Standing balance support: Bilateral upper extremity supported, During functional activity, Reliant on assistive device for balance Standing balance-Leahy Scale: Poor                               Pertinent Vitals/Pain Pain Assessment Pain Assessment: Faces Faces Pain Scale: Hurts little more Pain Location: L knee Pain Descriptors / Indicators: Constant, Discomfort, Grimacing, Guarding Pain Intervention(s):  Limited activity within patient's tolerance, Monitored during session, Repositioned    Home Living Family/patient expects to be discharged to:: Private residence Living Arrangements: Alone Available Help at Discharge: Family;Other (Comment) (caregiver assist per pt report) Type of Home: Apartment Home Access: Level entry       Home Layout: One level Home Equipment: Agricultural consultant (2  wheels);Wheelchair - manual;Shower seat;BSC/3in1 Additional Comments: pt is a poor historian and no family present to provide insight to PLOF    Prior Function Prior Level of Function : Needs assist;Patient poor historian/Family not available  Cognitive Assist : Mobility (cognitive);ADLs (cognitive) Mobility (Cognitive): Intermittent cues ADLs (Cognitive): Intermittent cues Physical Assist : Mobility (physical);ADLs (physical) Mobility (physical): Stairs;Gait ADLs (physical): IADLs Mobility Comments: pt was unable to relay if amb with RW PLOF. ADLs Comments: pt indicated her daughter helps with groceries and provides some meals, pt sates she does not cook and has caregiver assist sometimes but her daughter is unable to physically help her in home setting     Extremity/Trunk Assessment   Upper Extremity Assessment Upper Extremity Assessment:  (noted limited digit ROM and deformities attributed to arthitis)    Lower Extremity Assessment Lower Extremity Assessment: Generalized weakness    Cervical / Trunk Assessment Cervical / Trunk Assessment: Kyphotic  Communication   Communication Communication: No apparent difficulties  Cognition Arousal: Alert Behavior During Therapy: WFL for tasks assessed/performed Overall Cognitive Status: History of cognitive impairments - at baseline                                          General Comments General comments (skin integrity, edema, etc.): L medial aspect of knee noted edema and pain to light touch    Exercises     Assessment/Plan    PT Assessment Patient needs continued PT services  PT Problem List Decreased range of motion;Decreased strength;Decreased activity tolerance;Decreased balance;Decreased mobility;Decreased coordination;Pain       PT Treatment Interventions DME instruction;Gait training;Functional mobility training;Therapeutic activities;Therapeutic exercise;Balance training;Neuromuscular  re-education;Patient/family education    PT Goals (Current goals can be found in the Care Plan section)  Acute Rehab PT Goals Patient Stated Goal: to go home PT Goal Formulation: With patient Time For Goal Achievement: 08/22/23 Potential to Achieve Goals: Good    Frequency Min 1X/week     Co-evaluation               AM-PAC PT "6 Clicks" Mobility  Outcome Measure Help needed turning from your back to your side while in a flat bed without using bedrails?: None Help needed moving from lying on your back to sitting on the side of a flat bed without using bedrails?: A Little Help needed moving to and from a bed to a chair (including a wheelchair)?: A Little Help needed standing up from a chair using your arms (e.g., wheelchair or bedside chair)?: A Little Help needed to walk in hospital room?: A Little Help needed climbing 3-5 steps with a railing? : A Lot 6 Click Score: 18    End of Session Equipment Utilized During Treatment: Gait belt Activity Tolerance: Patient tolerated treatment well Patient left: in chair;with call bell/phone within reach;with chair alarm set Nurse Communication: Mobility status PT Visit Diagnosis: Unsteadiness on feet (R26.81);Muscle weakness (generalized) (M62.81);Pain;Difficulty in walking, not elsewhere classified (R26.2) Pain - Right/Left: Left Pain - part of body: Leg;Knee    Time: 1107-1130 PT Time Calculation (  min) (ACUTE ONLY): 23 min   Charges:   PT Evaluation $PT Eval Low Complexity: 1 Low PT Treatments $Gait Training: 8-22 mins PT General Charges $$ ACUTE PT VISIT: 1 Visit         Johnny Bridge, PT Acute Rehab   Jacqualyn Posey 08/08/2023, 12:59 PM

## 2023-08-08 NOTE — Progress Notes (Signed)
Initial Nutrition Assessment  DOCUMENTATION CODES:   Non-severe (moderate) malnutrition in context of chronic illness  INTERVENTION:   -Ensure Enlive po BID, each supplement provides 350 kcal and 20 grams of protein.   -Liberalize diet to regular given advanced age, and most recent HgbA1c: 6.8 which is controlled.  NUTRITION DIAGNOSIS:   Moderate Malnutrition related to chronic illness (dementia) as evidenced by mild fat depletion, moderate fat depletion.  GOAL:   Patient will meet greater than or equal to 90% of their needs  MONITOR:   PO intake, Supplement acceptance, Labs, Weight trends, I & O's  REASON FOR ASSESSMENT:   Consult Assessment of nutrition requirement/status  ASSESSMENT:   89 y.o. female with PMH significant for dementia, DM2, HTN, HLD, aortic insufficiency, diastolic CHF, PAD, PE on Eliquis, CKD, constipation, diverticulitis, hernia, prior bowel obstruction who lives alone.  8/29, patient was brought to the ED by EMS from home for worsening confusion, constipation, generalized weakness, poor intake and hydration.  Patient in chair, able to provide history at this time, history of dementia. Pt with no teeth, states she uses dentures at home, is able to chew foods okay here.  Pt ate some eggs, grits milk and coffee this morning. Ate some of her lunch of a salad, cantaloupe and milk. Would like Ensure here as she drinks this at home. Will liberalize diet given controlled blood sugars and advanced age.   Per weight records, no recent weight history available PTA.   Medications: Miralax  Labs reviewed: CBGs: 91-158   NUTRITION - FOCUSED PHYSICAL EXAM:  Flowsheet Row Most Recent Value  Orbital Region Mild depletion  Upper Arm Region Severe depletion  Thoracic and Lumbar Region No depletion  Buccal Region Mild depletion  Temple Region Mild depletion  Clavicle Bone Region Moderate depletion  Clavicle and Acromion Bone Region Moderate depletion  Scapular  Bone Region Moderate depletion  Dorsal Hand Moderate depletion  Patellar Region No depletion  Anterior Thigh Region No depletion  Posterior Calf Region Moderate depletion  Edema (RD Assessment) None  Hair Reviewed  Eyes Reviewed  Mouth Reviewed  [edentulous]  Skin Reviewed  Nails Reviewed       Diet Order:   Diet Order             Diet regular Room service appropriate? Yes with Assist; Fluid consistency: Thin  Diet effective now                   EDUCATION NEEDS:   Not appropriate for education at this time  Skin:  Skin Assessment: Reviewed RN Assessment  Last BM:  8/29  Height:   Ht Readings from Last 1 Encounters:  08/07/23 5\' 5"  (1.651 m)    Weight:   Wt Readings from Last 1 Encounters:  08/07/23 57 kg    BMI:  Body mass index is 20.91 kg/m.  Estimated Nutritional Needs:   Kcal:  1450-1650  Protein:  65-75g  Fluid:  1.5L/day   Tilda Franco, MS, RD, LDN Inpatient Clinical Dietitian Contact information available via Amion

## 2023-08-08 NOTE — Progress Notes (Signed)
   08/08/23 1303  TOC Brief Assessment  Insurance and Status Reviewed  Patient has primary care physician Yes  Home environment has been reviewed Yes home alone  Prior level of function: Independent  Prior/Current Home Services No current home services  Social Determinants of Health Reivew SDOH reviewed no interventions necessary  Readmission risk has been reviewed Yes (Observation)  Transition of care needs no transition of care needs at this time

## 2023-08-08 NOTE — Assessment & Plan Note (Signed)
Mildly elevated patient has history of slightly elevated troponins in the past we will continue to monitor Patient not endorsing any chest pain although originally she was calling her daughter stating she is dying without any particular complaints.  Currently appears to be much more alert and oriented Check echogram in the morning Cycle cardiac enzymes obtain serial EKG

## 2023-08-08 NOTE — TOC Progression Note (Signed)
Transition of Care [facility]) - Progression Note    Patient Details  Name: Kathryn Bradford MRN: [id number] Date of Birth: May 03, 1930  Transition of Care [facility]) CM/SW Contact  Beckie Busing, RN Phone [phone]  08/08/2023, 9:48 AM  Clinical Narrative:    TOC acknowledges general consult for Home Health / DME Needs. Currently there are no needs identified. TOC will follow for any recommendations.        Expected Discharge Plan and Services                                               Social Determinants of Health (SDOH) Interventions SDOH Screenings   Food Insecurity: No Food Insecurity (08/08/2023)  Housing: Low Risk  (08/08/2023)  Transportation Needs: No Transportation Needs (08/08/2023)  Utilities: Not At Risk (08/08/2023)  Tobacco Use: High Risk (05/01/2023)   Received from [facility]    Readmission Risk Interventions     No data to display

## 2023-08-09 ENCOUNTER — Ambulatory Visit (HOSPITAL_BASED_OUTPATIENT_CLINIC_OR_DEPARTMENT_OTHER): Payer: 59

## 2023-08-09 DIAGNOSIS — R7989 Other specified abnormal findings of blood chemistry: Secondary | ICD-10-CM

## 2023-08-09 DIAGNOSIS — E860 Dehydration: Secondary | ICD-10-CM

## 2023-08-09 DIAGNOSIS — N179 Acute kidney failure, unspecified: Secondary | ICD-10-CM | POA: Diagnosis not present

## 2023-08-09 LAB — CBC WITH DIFFERENTIAL/PLATELET
Abs Immature Granulocytes: 0 10*3/uL (ref 0.00–0.07)
Basophils Absolute: 0 10*3/uL (ref 0.0–0.1)
Basophils Relative: 1 %
Eosinophils Absolute: 0.1 10*3/uL (ref 0.0–0.5)
Eosinophils Relative: 3 %
HCT: 30.5 % — ABNORMAL LOW (ref 36.0–46.0)
Hemoglobin: 9.6 g/dL — ABNORMAL LOW (ref 12.0–15.0)
Immature Granulocytes: 0 %
Lymphocytes Relative: 32 %
Lymphs Abs: 1.1 10*3/uL (ref 0.7–4.0)
MCH: 30.7 pg (ref 26.0–34.0)
MCHC: 31.5 g/dL (ref 30.0–36.0)
MCV: 97.4 fL (ref 80.0–100.0)
Monocytes Absolute: 0.4 10*3/uL (ref 0.1–1.0)
Monocytes Relative: 13 %
Neutro Abs: 1.7 10*3/uL (ref 1.7–7.7)
Neutrophils Relative %: 51 %
Platelets: 149 10*3/uL — ABNORMAL LOW (ref 150–400)
RBC: 3.13 MIL/uL — ABNORMAL LOW (ref 3.87–5.11)
RDW: 15.1 % (ref 11.5–15.5)
WBC: 3.4 10*3/uL — ABNORMAL LOW (ref 4.0–10.5)
nRBC: 0 % (ref 0.0–0.2)

## 2023-08-09 LAB — BASIC METABOLIC PANEL
Anion gap: 5 (ref 5–15)
BUN: 22 mg/dL (ref 8–23)
CO2: 23 mmol/L (ref 22–32)
Calcium: 9.4 mg/dL (ref 8.9–10.3)
Chloride: 114 mmol/L — ABNORMAL HIGH (ref 98–111)
Creatinine, Ser: 0.98 mg/dL (ref 0.44–1.00)
GFR, Estimated: 54 mL/min — ABNORMAL LOW (ref >60)
Glucose, Bld: 85 mg/dL (ref 70–99)
Potassium: 4.4 mmol/L (ref 3.5–5.1)
Sodium: 142 mmol/L (ref 135–145)

## 2023-08-09 LAB — GLUCOSE, CAPILLARY
Glucose-Capillary: 95 mg/dL (ref 70–99)
Glucose-Capillary: 81 mg/dL (ref 70–99)
Glucose-Capillary: 145 mg/dL — ABNORMAL HIGH (ref 70–99)
Glucose-Capillary: 101 mg/dL — ABNORMAL HIGH (ref 70–99)
Glucose-Capillary: 118 mg/dL — ABNORMAL HIGH (ref 70–99)

## 2023-08-09 LAB — ECHOCARDIOGRAM COMPLETE
Area-P 1/2: 3.77 cm2
Height: 65 in
P 1/2 time: 540 ms
S' Lateral: 2.3 cm
Weight: 2010.6 [oz_av]

## 2023-08-09 MED ORDER — LORATADINE 10 MG PO TABS
10.0000 mg | ORAL_TABLET | Freq: Every day | ORAL | Status: DC | PRN
  Administered 2023-08-09: 10 mg via ORAL
  Filled 2023-08-09: qty 1

## 2023-08-09 NOTE — Progress Notes (Signed)
Mobility Specialist - Progress Note   08/09/23 1419  Mobility  Activity Ambulated with assistance in hallway  Level of Assistance Standby assist, set-up cues, supervision of patient - no hands on  Assistive Device Front wheel walker  Distance Ambulated (ft) 250 ft  Activity Response Tolerated well  Mobility Referral Yes  $Mobility charge 1 Mobility  Mobility Specialist Start Time (ACUTE ONLY) 0200  Mobility Specialist Stop Time (ACUTE ONLY) 0219  Mobility Specialist Time Calculation (min) (ACUTE ONLY) 19 min   Pt received in bed and agreeable to mobility. Pt was minA from STS. Pt took x1 seated rest break due to fatigue. No complaints during session.  Pt to bed after session with all needs met. Bed alarm on.   [facility]

## 2023-08-09 NOTE — TOC Progression Note (Signed)
Transition of Care [facility]) - Progression Note    Patient Details  Name: Kathryn Bradford MRN: [id number] Date of Birth: [birth year]  [facility]) CM/SW Contact  Carmina Miller, LCSWA Phone Number: 08/09/2023, 2:54 PM  Clinical Narrative:     CSW attempted to contact pt's daughter Clide Cliff in regards to [facility] recommendation, no answer, vm left.     Barriers to Discharge: Continued Medical Work up  Expected Discharge Plan and Services                                               Social Determinants of Health (SDOH) Interventions SDOH Screenings   Food Insecurity: No Food Insecurity (08/08/2023)  Housing: Low Risk  (08/08/2023)  Transportation Needs: No Transportation Needs (08/08/2023)  Utilities: Not At Risk (08/08/2023)  Alcohol Screen: Low Risk  (08/08/2023)  Depression (PHQ2-9): Low Risk  (08/08/2023)  Financial Resource Strain: Low Risk  (08/08/2023)  Physical Activity: Inactive (08/08/2023)  Social Connections: Moderately Integrated (08/08/2023)  Stress: No Stress Concern Present (08/08/2023)  Tobacco Use: High Risk (05/01/2023)   Received from [facility]  Health Literacy: Adequate Health Literacy (08/08/2023)    Readmission Risk Interventions     No data to display

## 2023-08-09 NOTE — Progress Notes (Signed)
PROGRESS NOTE    Rosebud Sharpless  OZD:[id number]  DOB: Aug 06, 1930  DOA: 08/07/2023 PCP: Sharmon Revere, MD Outpatient Specialists:   Hospital course:  89 y.o. female with PMH significant for dementia, DM2, HTN, HLD, aortic insufficiency, diastolic CHF, PAD, PE on Eliquis, CKD, constipation, diverticulitis, hernia, prior bowel obstruction who lives alone. 8/29, patient was brought to the ED by EMS from home for worsening confusion, constipation, generalized weakness, poor intake and hydration.  Workup revealed acute on chronic kidney injury thought to be secondary to poor intake and dehydration.  Subjective:  This morning patient states she feels okay.  Says she would like to go home.  Has no complaints.   Objective: Vitals:   08/08/23 1530 08/08/23 2027 08/09/23 0403 08/09/23 1330  BP: (!) 155/61 (!) 129/50 128/61 134/68  Pulse: (!) 56 69 63 68  Resp: 18 16 16    Temp: 97.7 F (36.5 C) 98 F (36.7 C) 98.3 F (36.8 C) 97.8 F (36.6 C)  TempSrc: Oral Oral Oral Oral  SpO2: 99% 100% 95% 100%  Weight:      Height:        Intake/Output Summary (Last 24 hours) at 08/09/2023 1848 Last data filed at 08/09/2023 2595 Gross per 24 hour  Intake 120 ml  Output --  Net 120 ml   Filed Weights   08/07/23 1514  Weight: 57 kg     Exam:  General: Patient sitting up in recliner in NAD, speaking normally and coherently answering questions appropriately. Eyes: sclera anicteric, conjuctiva mild injection bilaterally CVS: S1-S2, regular  Respiratory:  decreased air entry bilaterally secondary to decreased inspiratory effort, rales at bases  GI: NABS, soft, NT  LE: Warm and well-perfused  Data Reviewed:  Basic Metabolic Panel: Recent Labs  Lab 08/07/23 1645 08/07/23 2317 08/08/23 0050 08/09/23 0806  NA 141  --  140 142  K 4.3  --  3.7 4.4  CL 106  --  110 114*  CO2 26  --  25 23  GLUCOSE 105*  --  163* 85  BUN 35*  --  26* 22  CREATININE 1.55*  --  1.32* 0.98   CALCIUM 10.5*  --  9.9 9.4  MG  --  2.1 2.3  --   PHOS  --  2.9 2.6  --     CBC: Recent Labs  Lab 08/07/23 1645 08/08/23 0050 08/09/23 0806  WBC 4.9 4.1 3.4*  NEUTROABS 3.5  --  1.7  HGB 10.8* 10.4* 9.6*  HCT 33.0* 32.5* 30.5*  MCV 96.8 97.0 97.4  PLT 190 183 149*     Scheduled Meds:  diltiazem  30 mg Oral Q6H   donepezil  5 mg Oral QPM   feeding supplement  237 mL Oral BID BM   insulin aspart  0-9 Units Subcutaneous Q4H   latanoprost  1 drop Both Eyes QHS   polyethylene glycol  17 g Oral Daily   rivaroxaban  20 mg Oral Q supper   Continuous Infusions:   Assessment & Plan:   Acute on chronic kidney disease 3A Creatinine has normalized with IV fluid resuscitation Also appears to be eating better this morning  Metabolic encephalopathy Underlying dementia Patient seems essentially normal to me with some dementia but no waxing and waning mental status Metabolic encephalopathy was likely secondary to AKI which is resolved  Elevated troponin Elevation likely secondary to CKD  HTN Sinus bradycardia Diltiazem was decreased yesterday with improvement in heart rate BP continues to be well-controlled  DM 2 Blood sugars reasonable control on present regimen  H/oh VTE/PE Continue Xarelto    DVT prophylaxis: Xarelto Code Status: Full Family Communication: Multiple attempts to contact her daughter were unsuccessful, no answer to 2 different phone numbers.    Studies: ECHOCARDIOGRAM COMPLETE  Result Date: 08/09/2023    ECHOCARDIOGRAM REPORT   Patient Name:   LEGACIE DETORE Date of Exam: 08/09/2023 Medical Rec #:  [id number]       Height:       65.0 in Accession #:    [id number]      Weight:       125.7 lb Date of Birth:  January 21, 1930        BSA:          1.624 m Patient Age:    89 years        BP:           133/53 mmHg Patient Gender: F               HR:           55 bpm. Exam Location:  Inpatient Procedure: 2D Echo, Color Doppler and Cardiac Doppler Indications:     Elevated Troponin  History:        Patient has prior history of Echocardiogram examinations, most                 recent 03/14/2020. CHF, CKD and PAD, Aortic Valve Disease,                 Signs/Symptoms:Syncope; Risk Factors:Hypertension, Diabetes and                 Dyslipidemia.  Sonographer:    Milbert Coulter Referring Phys: [id number] ANASTASSIA DOUTOVA IMPRESSIONS  1. Left ventricular ejection fraction, by estimation, is 65 to 70%. The left ventricle has normal function. The left ventricle has no regional wall motion abnormalities. There is moderate left ventricular hypertrophy. Left ventricular diastolic parameters are consistent with Grade II diastolic dysfunction (pseudonormalization).  2. Right ventricular systolic function is normal. The right ventricular size is normal. Tricuspid regurgitation signal is inadequate for assessing PA pressure.  3. Left atrial size was moderately dilated.  4. The mitral valve is grossly normal. Trivial mitral valve regurgitation. No evidence of mitral stenosis.  5. The aortic valve is grossly normal. There is mild calcification of the aortic valve. Aortic valve regurgitation is mild. No aortic stenosis is present.  6. The inferior vena cava is normal in size with <50% respiratory variability, suggesting right atrial pressure of 8 mmHg. FINDINGS  Left Ventricle: Left ventricular ejection fraction, by estimation, is 65 to 70%. The left ventricle has normal function. The left ventricle has no regional wall motion abnormalities. The left ventricular internal cavity size was normal in size. There is  moderate left ventricular hypertrophy. Left ventricular diastolic parameters are consistent with Grade II diastolic dysfunction (pseudonormalization). Right Ventricle: The right ventricular size is normal. No increase in right ventricular wall thickness. Right ventricular systolic function is normal. Tricuspid regurgitation signal is inadequate for assessing PA pressure. Left Atrium: Left  atrial size was moderately dilated. Right Atrium: Right atrial size was normal in size. Pericardium: There is no evidence of pericardial effusion. Mitral Valve: The mitral valve is grossly normal. Trivial mitral valve regurgitation. No evidence of mitral valve stenosis. Tricuspid Valve: The tricuspid valve is grossly normal. Tricuspid valve regurgitation is trivial. No evidence of tricuspid stenosis. Aortic Valve: The aortic valve is grossly normal. There is  mild calcification of the aortic valve. Aortic valve regurgitation is mild. Aortic regurgitation PHT measures 540 msec. No aortic stenosis is present. Pulmonic Valve: The pulmonic valve was not well visualized. Pulmonic valve regurgitation is not visualized. No evidence of pulmonic stenosis. Aorta: The aortic root is normal in size and structure. Venous: The inferior vena cava is normal in size with less than 50% respiratory variability, suggesting right atrial pressure of 8 mmHg. IAS/Shunts: The atrial septum is grossly normal.  LEFT VENTRICLE PLAX 2D LVIDd:         3.30 cm   Diastology LVIDs:         2.30 cm   LV e' medial:    4.57 cm/s LV PW:         1.50 cm   LV E/e' medial:  23.2 LV IVS:        1.50 cm   LV e' lateral:   6.53 cm/s LVOT diam:     1.80 cm   LV E/e' lateral: 16.2 LV SV:         62 LV SV Index:   38 LVOT Area:     2.54 cm  RIGHT VENTRICLE RV Basal diam:  2.60 cm RV Mid diam:    2.10 cm RV S prime:     8.49 cm/s TAPSE (M-mode): 1.4 cm LEFT ATRIUM             Index        RIGHT ATRIUM           Index LA diam:        2.60 cm 1.60 cm/m   RA Area:     10.30 cm LA Vol (A2C):   58.8 ml 36.22 ml/m  RA Volume:   18.10 ml  11.15 ml/m LA Vol (A4C):   48.7 ml 30.00 ml/m LA Biplane Vol: 53.5 ml 32.95 ml/m  AORTIC VALVE LVOT Vmax:   87.00 cm/s LVOT Vmean:  61.700 cm/s LVOT VTI:    0.242 m AI PHT:      540 msec  AORTA Ao Root diam: 3.10 cm MITRAL VALVE MV Area (PHT): 3.77 cm     SHUNTS MV Decel Time: 201 msec     Systemic VTI:  0.24 m MV E velocity:  106.00 cm/s  Systemic Diam: 1.80 cm MV A velocity: 78.00 cm/s MV E/A ratio:  1.36 Weston Brass MD Electronically signed by Weston Brass MD Signature Date/Time: 08/09/2023/12:09:30 PM    Final    CT ABDOMEN PELVIS WO CONTRAST  Result Date: 08/07/2023 CLINICAL DATA:  Acute nonlocalized abdominal pain. Feels constipated and not eating or drinking well. Dementia with increased confusion. EXAM: CT ABDOMEN AND PELVIS WITHOUT CONTRAST TECHNIQUE: Multidetector CT imaging of the abdomen and pelvis was performed following the standard protocol without IV contrast. RADIATION DOSE REDUCTION: This exam was performed according to the departmental dose-optimization program which includes automated exposure control, adjustment of the mA and/or kV according to patient size and/or use of iterative reconstruction technique. COMPARISON:  05/01/2023 FINDINGS: Lower chest: Scarring and bronchitic changes in the lung bases. Hepatobiliary: No focal liver lesions. Cholelithiasis with small stones layering in the gallbladder. No inflammatory changes. No bile duct dilatation. Pancreas: Unremarkable. No pancreatic ductal dilatation or surrounding inflammatory changes. Spleen: Normal in size without focal abnormality. Adrenals/Urinary Tract: No adrenal gland nodules. Diffuse renal parenchymal atrophy bilaterally. No hydronephrosis or hydroureter. Bilateral renal cysts. Representative cyst on the right lower pole measures 5.6 cm in diameter. No change since prior studies. No  imaging follow-up is indicated. Bladder is normal. No renal, ureteral, or bladder stones. Stomach/Bowel: Stomach, small bowel, and colon are not abnormally distended. No wall thickening or inflammatory changes are appreciated. Postoperative right hemicolectomy with ileocolonic anastomosis. Ventral abdominal wall hernia containing small bowel and colon without evidence of proximal obstruction. Colonic diverticulosis without evidence of acute diverticulitis.  Vascular/Lymphatic: Prominent calcification of the aorta and major branch vessels. No aneurysm. No significant lymphadenopathy. Reproductive: Status post hysterectomy. No adnexal masses. Other: No free air or free fluid in the abdomen. Fatty atrophy of the abdominal and hip musculature. Musculoskeletal: Degenerative changes in the spine and hips. No acute bony abnormalities. IMPRESSION: 1. Scarring and bronchitic changes in the lung bases. 2. Cholelithiasis without evidence of acute cholecystitis. 3. No evidence of bowel obstruction or inflammation. Ventral abdominal wall hernia contains small bowel and colon without evidence of proximal obstruction. 4. Prominent aortic atherosclerosis. 5. Colonic diverticulosis without evidence of acute diverticulitis. Electronically Signed   By: Burman Nieves M.D.   On: 08/07/2023 19:38   CT Head Wo Contrast  Result Date: 08/07/2023 CLINICAL DATA:  Minor head trauma. Dementia with increased confusion. EXAM: CT HEAD WITHOUT CONTRAST TECHNIQUE: Contiguous axial images were obtained from the base of the skull through the vertex without intravenous contrast. RADIATION DOSE REDUCTION: This exam was performed according to the departmental dose-optimization program which includes automated exposure control, adjustment of the mA and/or kV according to patient size and/or use of iterative reconstruction technique. COMPARISON:  MRI brain 07/17/2018.  CT head 03/13/2020 FINDINGS: Brain: Diffuse cerebral atrophy. Ventricular dilatation consistent with central atrophy. Low-attenuation changes in the deep white matter consistent with small vessel ischemia. No abnormal extra-axial fluid collections. No mass effect or midline shift. Gray-white matter junctions are distinct. Basal cisterns are not effaced. No acute intracranial hemorrhage. Vascular: No hyperdense vessel or unexpected calcification. Skull: Normal. Negative for fracture or focal lesion. Sinuses/Orbits: No acute finding.  Other: None. IMPRESSION: No acute intracranial abnormalities. Chronic atrophy and small vessel ischemic changes. Electronically Signed   By: Burman Nieves M.D.   On: 08/07/2023 19:30    Principal Problem:   AKI (acute kidney injury) (HCC) Active Problems:   Elevated troponin   Chronic constipation   Essential hypertension   Hx of pulmonary embolus   CKD (chronic kidney disease), stage III (HCC)   Dementia (HCC)   Acute metabolic encephalopathy     Dallyn Bergland Orma Flaming, [facility]  If 7PM-7AM, please contact night-coverage [email or web address]   LOS: 0 days

## 2023-08-09 NOTE — Plan of Care (Signed)

## 2023-08-09 NOTE — Progress Notes (Signed)
Occupational Therapy Treatment Patient Details Name: Kathryn Bradford MRN: [id number] DOB: [birth year] Today's Date: 08/09/2023   History of present illness Patient is a 89 yo female presents to therapy following hospital admission on 08/07/2023 secondary to AMS, progressive weakness, abdominal pain and poor PO intake. Pt found to have mild AKI.  Pt PMH includes but is not limited to: SM II, SBO, aortic insufficiency, dCHF, peripheral arterial dz, HTN, HLD, PE, DVT, glaucoma, constipation and dementia.   OT comments  Patient was noted to have continued confusion and poor safety awareness. Patient bumped into various objects in room with RW and had difficulty with problem solving during session. Patient would need 24/7 supervision in the next level of care to be successful. Patients daughter called during session and had concerns over patients production on phlegm and d/c home. No Phlegm was seen during session. Patient's nurse consulted and nurse and MD already aware. Patient would continue to benefit from skilled OT services at this time while admitted and after d/c to address noted deficits in order to improve overall safety and independence in ADLs.        If plan is discharge home, recommend the following:  A little help with bathing/dressing/bathroom;Assistance with cooking/housework;Direct supervision/assist for medications management;Direct supervision/assist for financial management;Assist for transportation;Supervision due to cognitive status   Equipment Recommendations  None recommended by OT       Precautions / Restrictions Precautions Precautions: Fall Restrictions Weight Bearing Restrictions: No       Mobility Bed Mobility Overal bed mobility: Needs Assistance Bed Mobility: Supine to Sit     Supine to sit: Contact guard, HOB elevated     General bed mobility comments: with min cues for proper positioning.               ADL either performed or assessed with  clinical judgement   ADL Overall ADL's : Needs assistance/impaired     Grooming: Supervision/safety;Wash/dry face;Sitting;Wash/dry Programmer, applications Details (indicate cue type and reason): with increased time Upper Body Bathing: Set up;Sitting Upper Body Bathing Details (indicate cue type and reason): in recliner     Upper Body Dressing : Set up;Sitting       Toilet Transfer: Supervision/safety;Ambulation;Rolling walker (2 wheels) Toilet Transfer Details (indicate cue type and reason): with increase time and patient reporting LLE was bugging her. Toileting- Clothing Manipulation and Hygiene: Supervision/safety Toileting - Clothing Manipulation Details (indicate cue type and reason): patient was noted to have toilet paper in her mesh underwear ontop of pad. patient was educated on how toilet paper was not moisture wicking and can irritate skin more. patient reported that she used it to soak up and drippage she might have. patient was educated to use the absorbent pads made for urinary incontinence. patient verbalized understanding.              Cognition Arousal: Alert Behavior During Therapy: WFL for tasks assessed/performed Overall Cognitive Status: No family/caregiver present to determine baseline cognitive functioning           General Comments: Patient with continued confusion and poor safety awarness. patients daughter called room phone during session and reported that patient has trouble at home and she was concerned about her going back home.              General Comments patients daughter called during session reporting that patient is having increased phlem in mouth and is unable to do anything with it or swallow it back down. patients daughter was educated that therapist  was unable to address this issue further but did make nurse aware. patients daughter very concerned with this and asking to speak to the nurse herself. patietns daughter was educated that nurse was  busy in another patient room and that she was currently speaking to therapsit on patietns phone in room with no ability to transfer phone call to front desk. patients daughter was instructed to call back and ask for the front desk for the unit. patients daughter reported she had already called that once this AM. patient's daughter asked if therapist was working on d/c for patient. patients daughter was educated that MD is incharge of d/c not therapist. patients daughter was educated on therapists role in room on this date to work towards making patient safer with ADL tasks. patients daughter reported that patient has issues at home and was not safe. patients daughter reported she wanted to speak to MD about this. nurse made aware. patients daughters phone number and name were listed on board in room already.  patient was not observed to have any coughing or phlem during session was educated on spitting into emesis bag and provided with new one.    Pertinent Vitals/ Pain       Pain Assessment Pain Assessment: Faces Faces Pain Scale: Hurts a little bit Pain Location: L knee         Frequency  Min 1X/week        Progress Toward Goals  OT Goals(current goals can now be found in the care plan section)  Progress towards OT goals: Progressing toward goals     Plan         AM-PAC OT "6 Clicks" Daily Activity     Outcome Measure   Help from another person eating meals?: None Help from another person taking care of personal grooming?: A Little Help from another person toileting, which includes using toliet, bedpan, or urinal?: A Little Help from another person bathing (including washing, rinsing, drying)?: A Little Help from another person to put on and taking off regular upper body clothing?: A Little Help from another person to put on and taking off regular lower body clothing?: A Little 6 Click Score: 19    End of Session Equipment Utilized During Treatment: Rolling walker (2  wheels);Other (comment) (BSC)  OT Visit Diagnosis: Unsteadiness on feet (R26.81);Other abnormalities of gait and mobility (R26.89);Muscle weakness (generalized) (M62.81);Other symptoms and signs involving cognitive function   Activity Tolerance Patient tolerated treatment well   Patient Left in chair;with call bell/phone within reach;with chair alarm set   Nurse Communication Mobility status        Time: 8469-6295 OT Time Calculation (min): 23 min  Charges: OT General Charges $OT Visit: 1 Visit OT Treatments $Self Care/Home Management : 23-37 mins  Rosalio Loud, MS Acute Rehabilitation Department Office# [phone]   Selinda Flavin 08/09/2023, 12:43 PM

## 2023-08-10 DIAGNOSIS — N179 Acute kidney failure, unspecified: Secondary | ICD-10-CM | POA: Diagnosis not present

## 2023-08-10 LAB — GLUCOSE, CAPILLARY
Glucose-Capillary: 114 mg/dL — ABNORMAL HIGH (ref 70–99)
Glucose-Capillary: 162 mg/dL — ABNORMAL HIGH (ref 70–99)
Glucose-Capillary: 86 mg/dL (ref 70–99)
Glucose-Capillary: 87 mg/dL (ref 70–99)
Glucose-Capillary: 94 mg/dL (ref 70–99)
Glucose-Capillary: 98 mg/dL (ref 70–99)

## 2023-08-10 LAB — BASIC METABOLIC PANEL WITH GFR
Anion gap: 7 (ref 5–15)
BUN: 17 mg/dL (ref 8–23)
CO2: 24 mmol/L (ref 22–32)
Calcium: 10 mg/dL (ref 8.9–10.3)
Chloride: 110 mmol/L (ref 98–111)
Creatinine, Ser: 0.98 mg/dL (ref 0.44–1.00)
GFR, Estimated: 54 mL/min — ABNORMAL LOW (ref >60)
Glucose, Bld: 101 mg/dL — ABNORMAL HIGH (ref 70–99)
Potassium: 4.2 mmol/L (ref 3.5–5.1)
Sodium: 141 mmol/L (ref 135–145)

## 2023-08-10 LAB — CBC WITH DIFFERENTIAL/PLATELET
Abs Immature Granulocytes: 0.01 K/uL (ref 0.00–0.07)
Basophils Absolute: 0 K/uL (ref 0.0–0.1)
Basophils Relative: 1 %
Eosinophils Absolute: 0.2 K/uL (ref 0.0–0.5)
Eosinophils Relative: 4 %
HCT: 30.5 % — ABNORMAL LOW (ref 36.0–46.0)
Hemoglobin: 9.6 g/dL — ABNORMAL LOW (ref 12.0–15.0)
Immature Granulocytes: 0 %
Lymphocytes Relative: 27 %
Lymphs Abs: 1.1 K/uL (ref 0.7–4.0)
MCH: 30.5 pg (ref 26.0–34.0)
MCHC: 31.5 g/dL (ref 30.0–36.0)
MCV: 96.8 fL (ref 80.0–100.0)
Monocytes Absolute: 0.4 K/uL (ref 0.1–1.0)
Monocytes Relative: 11 %
Neutro Abs: 2.2 K/uL (ref 1.7–7.7)
Neutrophils Relative %: 57 %
Platelets: 165 K/uL (ref 150–400)
RBC: 3.15 MIL/uL — ABNORMAL LOW (ref 3.87–5.11)
RDW: 15 % (ref 11.5–15.5)
WBC: 3.9 K/uL — ABNORMAL LOW (ref 4.0–10.5)
nRBC: 0 % (ref 0.0–0.2)

## 2023-08-10 MED ORDER — AMLODIPINE BESYLATE 5 MG PO TABS
5.0000 mg | ORAL_TABLET | Freq: Every day | ORAL | Status: DC
  Administered 2023-08-10 – 2023-08-14 (×5): 5 mg via ORAL
  Filled 2023-08-10 (×5): qty 1

## 2023-08-10 NOTE — Plan of Care (Signed)
  Problem: Activity: Goal: Risk for activity intolerance will decrease Outcome: Progressing   Problem: Nutrition: Goal: Adequate nutrition will be maintained Outcome: Progressing   Problem: Coping: Goal: Level of anxiety will decrease Outcome: Progressing   Problem: Elimination: Goal: Will not experience complications related to bowel motility Outcome: Progressing   Problem: Pain Managment: Goal: General experience of comfort will improve Outcome: Progressing   

## 2023-08-10 NOTE — [place] FL2 (Signed)
[place] MEDICAID FL2 LEVEL OF CARE FORM     IDENTIFICATION  Patient Name: Kathryn Bradford Birthdate: [birth year] Sex: female Admission Date (Current Location): 08/07/2023  [facility] and [place] Number:  Producer, television/film/video and Address:  [facility],  [address], [place] [address]      Provider Number: [id number]  Attending Physician Name and Address:  Salomon Mast*  Relative Name and Phone Number:  Porfirio Oar [phone]    Current Level of Care: Hospital Recommended Level of Care: Skilled Nursing Facility Prior Approval Number:    Date Approved/Denied:   PASRR Number: [id number]  Discharge Plan: SNF    Current Diagnoses: Patient Active Problem List   Diagnosis Date Noted   Dementia (HCC) 08/07/2023   Acute metabolic encephalopathy 08/07/2023   Diabetes mellitus type 2, uncontrolled, with complications 03/14/2020   SBO (small bowel obstruction) (HCC) 03/14/2020   Diverticulosis 03/14/2020   Hx of diverticulitis of colon 03/14/2020   Aortic insufficiency 03/14/2020   Chronic diastolic CHF (congestive heart failure) (HCC) 03/14/2020   PAD (peripheral artery disease) (HCC) 03/14/2020   Essential hypertension 03/14/2020   HLD (hyperlipidemia) 03/14/2020   Hx of pulmonary embolus 03/14/2020   Hx of pulmonary infarction 03/14/2020   DVT (deep venous thrombosis) (HCC) 03/14/2020   CKD (chronic kidney disease), stage III (HCC) 03/14/2020   Chronic anticoagulation 03/14/2020   AKI (acute kidney injury) (HCC) 03/13/2020   Type 2 diabetes mellitus (HCC) 03/13/2020   Pulmonary emboli (HCC)    Hypertension    Glaucoma    Acute lower UTI    Elevated bilirubin    Elevated troponin    Chronic constipation    Syncope    Incisional hernia with gangrene and obstruction 10/05/2013   Acute posthemorrhagic anemia 10/05/2013   Type II or unspecified type diabetes mellitus without mention of complication, not stated as uncontrolled 10/05/2013    Phlebitis and thrombophlebitis of other deep vessels of lower extremities 10/05/2013   Other pulmonary embolism and infarction 10/05/2013    Orientation RESPIRATION BLADDER Height & Weight     Self, Time, Situation, Place  Normal Incontinent, External catheter Weight: 125 lb 10.6 oz (57 kg) Height:  5\' 5"  (165.1 cm)  BEHAVIORAL SYMPTOMS/MOOD NEUROLOGICAL BOWEL NUTRITION STATUS      Incontinent Diet (see dc summary)  AMBULATORY STATUS COMMUNICATION OF NEEDS Skin   Extensive Assist Verbally Normal                       Personal Care Assistance Level of Assistance  Bathing, Feeding, Dressing Bathing Assistance: Limited assistance Feeding assistance: Limited assistance Dressing Assistance: Limited assistance     Functional Limitations Info  Sight, Hearing, Speech Sight Info: Impaired Hearing Info: Adequate Speech Info: Adequate    SPECIAL CARE FACTORS FREQUENCY  PT (By licensed PT), OT (By licensed OT)     PT Frequency: 5x week OT Frequency: 5x week            Contractures Contractures Info: Not present    Additional Factors Info  Code Status, Allergies, Insulin Sliding Scale Code Status Info: Full Allergies Info: Penicillins   Insulin Sliding Scale Info: see dc summary       Current Medications (08/10/2023):  This is the current hospital active medication list Current Facility-Administered Medications  Medication Dose Route Frequency Provider Last Rate Last Admin   acetaminophen (TYLENOL) tablet 650 mg  650 mg Oral Q6H PRN Therisa Doyne, MD  Or   acetaminophen (TYLENOL) suppository 650 mg  650 mg Rectal Q6H PRN Doutova, Anastassia, MD       amLODipine (NORVASC) tablet 5 mg  5 mg Oral Daily Leandro Reasoner Tublu, MD   5 mg at 08/10/23 1528   donepezil (ARICEPT) tablet 5 mg  5 mg Oral QPM Doutova, Anastassia, MD   5 mg at 08/09/23 1800   feeding supplement (ENSURE ENLIVE / ENSURE PLUS) liquid 237 mL  237 mL Oral BID BM Dahal, Binaya, MD   237 mL  at 08/09/23 1431   HYDROcodone-acetaminophen (NORCO/VICODIN) 5-325 MG per tablet 1-2 tablet  1-2 tablet Oral Q4H PRN Doutova, Anastassia, MD       insulin aspart (novoLOG) injection 0-9 Units  0-9 Units Subcutaneous Q4H Doutova, Anastassia, MD   1 Units at 08/09/23 1249   latanoprost (XALATAN) 0.005 % ophthalmic solution 1 drop  1 drop Both Eyes QHS Doutova, Anastassia, MD   1 drop at 08/09/23 2129   loratadine (CLARITIN) tablet 10 mg  10 mg Oral Daily PRN Leandro Reasoner Tublu, MD   10 mg at 08/09/23 1019   ondansetron (ZOFRAN) tablet 4 mg  4 mg Oral Q6H PRN Therisa Doyne, MD       Or   ondansetron (ZOFRAN) injection 4 mg  4 mg Intravenous Q6H PRN Doutova, Anastassia, MD       polyethylene glycol (MIRALAX / GLYCOLAX) packet 17 g  17 g Oral Daily Doutova, Anastassia, MD   17 g at 08/10/23 1303   rivaroxaban (XARELTO) tablet 20 mg  20 mg Oral Q supper Doutova, Anastassia, MD   20 mg at 08/09/23 1800   traMADol (ULTRAM) tablet 50 mg  50 mg Oral Q12H PRN Therisa Doyne, MD   50 mg at 08/10/23 1433     Discharge Medications: Please see discharge summary for a list of discharge medications.  Relevant Imaging Results:  Relevant Lab Results:   Additional Information    Merelin Human B Jordyn Doane, LCSWA

## 2023-08-10 NOTE — Progress Notes (Signed)
PROGRESS NOTE    Kathryn Bradford  ZOX:[id number]  DOB: [birth year]  DOA: 08/07/2023 PCP: Sharmon Revere, MD Outpatient Specialists:   Hospital course:  89 y.o. female with PMH significant for dementia, DM2, HTN, HLD, aortic insufficiency, diastolic CHF, PAD, PE on Eliquis, CKD, constipation, diverticulitis, hernia, prior bowel obstruction who lives alone was admitted for worsening confusion, generalized weakness, decreased p.o. intake and acute on chronic renal failure.  She was treated with hydration with improvement in her kidney function.  Patient however does have significant dementia and is unable to go home to live by herself.   Subjective:  This morning patient states she feels okay.  Says she would like to go home.   Long conversation with patient's daughter Kathryn Bradford who expressed her concerns about her mother living by herself.  She has been trying to convince her mother that she can no longer live alone however her mother has resisted.  Kathryn Bradford is also aware that her mother is no longer eating and drinking very much at all.  She is open to discussing EOL options with palliative care.    Objective: Vitals:   08/09/23 2138 08/10/23 0459 08/10/23 1342 08/10/23 1432  BP: (!) 143/73 (!) 168/65 (!) 181/81 (!) 181/81  Pulse: (!) 57 (!) 54 77   Resp: 17 18    Temp: 98.1 F (36.7 C) 97.7 F (36.5 C) 98.5 F (36.9 C)   TempSrc: Oral Oral Oral   SpO2: 99% 100% 98%   Weight:      Height:       No intake or output data in the 24 hours ending 08/10/23 1452  Filed Weights   08/07/23 1514  Weight: 57 kg     Exam:  General: Patient sitting up in recliner in NAD, speaking normally and answering yes/no questions appropriately but for longer questions she rambles on and gets confused. Eyes: sclera anicteric, conjuctiva mild injection bilaterally CVS: S1-S2, regular  Respiratory:  decreased air entry bilaterally secondary to decreased inspiratory effort, rales at bases   GI: NABS, soft, NT  LE: Warm and well-perfused  Data Reviewed:  Basic Metabolic Panel: Recent Labs  Lab 08/07/23 1645 08/07/23 2317 08/08/23 0050 08/09/23 0806 08/10/23 0711  NA 141  --  140 142 141  K 4.3  --  3.7 4.4 4.2  CL 106  --  110 114* 110  CO2 26  --  25 23 24   GLUCOSE 105*  --  163* 85 101*  BUN 35*  --  26* 22 17  CREATININE 1.55*  --  1.32* 0.98 0.98  CALCIUM 10.5*  --  9.9 9.4 10.0  MG  --  2.1 2.3  --   --   PHOS  --  2.9 2.6  --   --     CBC: Recent Labs  Lab 08/07/23 1645 08/08/23 0050 08/09/23 0806 08/10/23 0711  WBC 4.9 4.1 3.4* 3.9*  NEUTROABS 3.5  --  1.7 2.2  HGB 10.8* 10.4* 9.6* 9.6*  HCT 33.0* 32.5* 30.5* 30.5*  MCV 96.8 97.0 97.4 96.8  PLT 190 183 149* 165     Scheduled Meds:  diltiazem  30 mg Oral Q6H   donepezil  5 mg Oral QPM   feeding supplement  237 mL Oral BID BM   insulin aspart  0-9 Units Subcutaneous Q4H   latanoprost  1 drop Both Eyes QHS   polyethylene glycol  17 g Oral Daily   rivaroxaban  20 mg Oral Q supper  Continuous Infusions:   Assessment & Plan:   Metabolic encephalopathy--resolved Underlying dementia Disposition Patient's mental status is back to her baseline. Patient's daughter Kathryn Bradford does not feel that she can go back to living alone, I concur but patient wants to go home. Kathryn Bradford is aware that patient is not eating and drinking much at all hence her admission for AKI She is open to discussing EOL options with palliative care, palliative care consult placed, she may benefit from hospice  Acute on chronic kidney disease 3A Creatinine has normalized with IV fluid resuscitation  Elevated troponin Stable, elevation likely secondary to CKD No further workup needed  HTN Sinus bradycardia Diltiazem was decreased 2 days ago with some improvement in heart rate However blood pressure is quite elevated today Will discontinue diltiazem today and start amlodipine 5 mg today  Will uptitrate medication as  needed during the hospital stay  DM 2 Blood sugars reasonable control on present regimen  H/oh VTE/PE Continue Xarelto    DVT prophylaxis: Xarelto Code Status: Full Family Communication: Long conversation with patient's daughter Kathryn Bradford.  TOC and palliative care are referred to [facility] to discuss disposition and EOL options.   Studies: ECHOCARDIOGRAM COMPLETE  Result Date: 08/09/2023    ECHOCARDIOGRAM REPORT   Patient Name:   Kathryn Bradford Date of Exam: 08/09/2023 Medical Rec #:  [id number]       Height:       65.0 in Accession #:    [id number]      Weight:       125.7 lb Date of Birth:  1930/03/27        BSA:          1.624 m Patient Age:    89 years        BP:           133/53 mmHg Patient Gender: F               HR:           55 bpm. Exam Location:  Inpatient Procedure: 2D Echo, Color Doppler and Cardiac Doppler Indications:    Elevated Troponin  History:        Patient has prior history of Echocardiogram examinations, most                 recent 03/14/2020. CHF, CKD and PAD, Aortic Valve Disease,                 Signs/Symptoms:Syncope; Risk Factors:Hypertension, Diabetes and                 Dyslipidemia.  Sonographer:    Milbert Coulter Referring Phys: [id number] ANASTASSIA DOUTOVA IMPRESSIONS  1. Left ventricular ejection fraction, by estimation, is 65 to 70%. The left ventricle has normal function. The left ventricle has no regional wall motion abnormalities. There is moderate left ventricular hypertrophy. Left ventricular diastolic parameters are consistent with Grade II diastolic dysfunction (pseudonormalization).  2. Right ventricular systolic function is normal. The right ventricular size is normal. Tricuspid regurgitation signal is inadequate for assessing PA pressure.  3. Left atrial size was moderately dilated.  4. The mitral valve is grossly normal. Trivial mitral valve regurgitation. No evidence of mitral stenosis.  5. The aortic valve is grossly normal. There is mild calcification of the aortic  valve. Aortic valve regurgitation is mild. No aortic stenosis is present.  6. The inferior vena cava is normal in size with <50% respiratory variability, suggesting right atrial pressure of 8 mmHg. FINDINGS  Left Ventricle: Left ventricular ejection fraction, by estimation, is 65 to 70%. The left ventricle has normal function. The left ventricle has no regional wall motion abnormalities. The left ventricular internal cavity size was normal in size. There is  moderate left ventricular hypertrophy. Left ventricular diastolic parameters are consistent with Grade II diastolic dysfunction (pseudonormalization). Right Ventricle: The right ventricular size is normal. No increase in right ventricular wall thickness. Right ventricular systolic function is normal. Tricuspid regurgitation signal is inadequate for assessing PA pressure. Left Atrium: Left atrial size was moderately dilated. Right Atrium: Right atrial size was normal in size. Pericardium: There is no evidence of pericardial effusion. Mitral Valve: The mitral valve is grossly normal. Trivial mitral valve regurgitation. No evidence of mitral valve stenosis. Tricuspid Valve: The tricuspid valve is grossly normal. Tricuspid valve regurgitation is trivial. No evidence of tricuspid stenosis. Aortic Valve: The aortic valve is grossly normal. There is mild calcification of the aortic valve. Aortic valve regurgitation is mild. Aortic regurgitation PHT measures 540 msec. No aortic stenosis is present. Pulmonic Valve: The pulmonic valve was not well visualized. Pulmonic valve regurgitation is not visualized. No evidence of pulmonic stenosis. Aorta: The aortic root is normal in size and structure. Venous: The inferior vena cava is normal in size with less than 50% respiratory variability, suggesting right atrial pressure of 8 mmHg. IAS/Shunts: The atrial septum is grossly normal.  LEFT VENTRICLE PLAX 2D LVIDd:         3.30 cm   Diastology LVIDs:         2.30 cm   LV e'  medial:    4.57 cm/s LV PW:         1.50 cm   LV E/e' medial:  23.2 LV IVS:        1.50 cm   LV e' lateral:   6.53 cm/s LVOT diam:     1.80 cm   LV E/e' lateral: 16.2 LV SV:         62 LV SV Index:   38 LVOT Area:     2.54 cm  RIGHT VENTRICLE RV Basal diam:  2.60 cm RV Mid diam:    2.10 cm RV S prime:     8.49 cm/s TAPSE (M-mode): 1.4 cm LEFT ATRIUM             Index        RIGHT ATRIUM           Index LA diam:        2.60 cm 1.60 cm/m   RA Area:     10.30 cm LA Vol (A2C):   58.8 ml 36.22 ml/m  RA Volume:   18.10 ml  11.15 ml/m LA Vol (A4C):   48.7 ml 30.00 ml/m LA Biplane Vol: 53.5 ml 32.95 ml/m  AORTIC VALVE LVOT Vmax:   87.00 cm/s LVOT Vmean:  61.700 cm/s LVOT VTI:    0.242 m AI PHT:      540 msec  AORTA Ao Root diam: 3.10 cm MITRAL VALVE MV Area (PHT): 3.77 cm     SHUNTS MV Decel Time: 201 msec     Systemic VTI:  0.24 m MV E velocity: 106.00 cm/s  Systemic Diam: 1.80 cm MV A velocity: 78.00 cm/s MV E/A ratio:  1.36 Weston Brass MD Electronically signed by Weston Brass MD Signature Date/Time: 08/09/2023/12:09:30 PM    Final     Principal Problem:   AKI (acute kidney injury) (HCC) Active Problems:   Elevated troponin  Chronic constipation   Essential hypertension   Hx of pulmonary embolus   CKD (chronic kidney disease), stage III (HCC)   Dementia (HCC)   Acute metabolic encephalopathy     Kathryn Bradford Orma Flaming, [facility]  If 7PM-7AM, please contact night-coverage [email or web address]   LOS: 0 days

## 2023-08-10 NOTE — TOC Progression Note (Addendum)
Transition of Care [facility]) - Progression Note    Patient Details  Name: Clemma Mandala MRN: [id number] Date of Birth: 1930-04-23  [facility] CM/SW Contact  Carmina Miller, LCSWA Phone Number: 08/10/2023, 11:19 AM  Clinical Narrative:    Update- 3:00 pm-spoke with pt's daughter Clide Cliff, she states pt needs STR, interested in LTC, advised we could place STR and she could work with SNF for LTC. Laverne states pt has dual plan, explained pt will need long term care medicaid for placement, she verbalized understanding, explained insurance auth process.   CSW attempted to speak with pt's daughters concerning HH PT recs, no answer on any of the numbers. TOC will continue to follow.      Barriers to Discharge: Continued Medical Work up  Expected Discharge Plan and Services                                               Social Determinants of Health (SDOH) Interventions SDOH Screenings   Food Insecurity: No Food Insecurity (08/08/2023)  Housing: Low Risk  (08/08/2023)  Transportation Needs: No Transportation Needs (08/08/2023)  Utilities: Not At Risk (08/08/2023)  Alcohol Screen: Low Risk  (08/08/2023)  Depression (PHQ2-9): Low Risk  (08/08/2023)  Financial Resource Strain: Low Risk  (08/08/2023)  Physical Activity: Inactive (08/08/2023)  Social Connections: Moderately Integrated (08/08/2023)  Stress: No Stress Concern Present (08/08/2023)  Tobacco Use: High Risk (05/01/2023)   Received from [facility]  Health Literacy: Adequate Health Literacy (08/08/2023)    Readmission Risk Interventions     No data to display

## 2023-08-10 NOTE — Plan of Care (Signed)
  Problem: Education: Goal: Ability to describe self-care measures that may prevent or decrease complications (Diabetes Survival Skills Education) will improve Outcome: Progressing Goal: Individualized Educational Video(s) Outcome: Progressing   Problem: Coping: Goal: Ability to adjust to condition or change in health will improve Outcome: Progressing   Problem: Fluid Volume: Goal: Ability to maintain a balanced intake and output will improve Outcome: Progressing   Problem: Metabolic: Goal: Ability to maintain appropriate glucose levels will improve Outcome: Progressing   Problem: Nutritional: Goal: Maintenance of adequate nutrition will improve Outcome: Progressing Goal: Progress toward achieving an optimal weight will improve Outcome: Progressing   Problem: Skin Integrity: Goal: Risk for impaired skin integrity will decrease Outcome: Progressing   Problem: Tissue Perfusion: Goal: Adequacy of tissue perfusion will improve Outcome: Progressing   Problem: Education: Goal: Knowledge of General Education information will improve Description: Including pain rating scale, medication(s)/side effects and non-pharmacologic comfort measures Outcome: Progressing   Problem: Health Behavior/Discharge Planning: Goal: Ability to manage health-related needs will improve Outcome: Progressing   Problem: Clinical Measurements: Goal: Will remain free from infection Outcome: Progressing Goal: Diagnostic test results will improve Outcome: Progressing Goal: Respiratory complications will improve Outcome: Progressing Goal: Cardiovascular complication will be avoided Outcome: Progressing   Problem: Activity: Goal: Risk for activity intolerance will decrease Outcome: Progressing   Problem: Nutrition: Goal: Adequate nutrition will be maintained Outcome: Progressing   Problem: Coping: Goal: Level of anxiety will decrease Outcome: Progressing   Problem: Elimination: Goal: Will not  experience complications related to bowel motility Outcome: Progressing Goal: Will not experience complications related to urinary retention Outcome: Progressing   Problem: Pain Managment: Goal: General experience of comfort will improve Outcome: Progressing   Problem: Safety: Goal: Ability to remain free from injury will improve Outcome: Progressing   Problem: Skin Integrity: Goal: Risk for impaired skin integrity will decrease Outcome: Progressing

## 2023-08-11 DIAGNOSIS — F03A0 Unspecified dementia, mild, without behavioral disturbance, psychotic disturbance, mood disturbance, and anxiety: Secondary | ICD-10-CM | POA: Diagnosis not present

## 2023-08-11 DIAGNOSIS — Z7189 Other specified counseling: Secondary | ICD-10-CM | POA: Diagnosis not present

## 2023-08-11 DIAGNOSIS — N179 Acute kidney failure, unspecified: Secondary | ICD-10-CM | POA: Diagnosis not present

## 2023-08-11 DIAGNOSIS — E440 Moderate protein-calorie malnutrition: Secondary | ICD-10-CM | POA: Insufficient documentation

## 2023-08-11 LAB — BASIC METABOLIC PANEL
Anion gap: 4 — ABNORMAL LOW (ref 5–15)
BUN: 18 mg/dL (ref 8–23)
CO2: 27 mmol/L (ref 22–32)
Calcium: 9.8 mg/dL (ref 8.9–10.3)
Chloride: 108 mmol/L (ref 98–111)
Creatinine, Ser: 0.91 mg/dL (ref 0.44–1.00)
GFR, Estimated: 60 mL/min — ABNORMAL LOW (ref >60)
Glucose, Bld: 91 mg/dL (ref 70–99)
Potassium: 4.2 mmol/L (ref 3.5–5.1)
Sodium: 139 mmol/L (ref 135–145)

## 2023-08-11 LAB — GLUCOSE, CAPILLARY
Glucose-Capillary: 159 mg/dL — ABNORMAL HIGH (ref 70–99)
Glucose-Capillary: 198 mg/dL — ABNORMAL HIGH (ref 70–99)
Glucose-Capillary: 82 mg/dL (ref 70–99)
Glucose-Capillary: 84 mg/dL (ref 70–99)
Glucose-Capillary: 88 mg/dL (ref 70–99)
Glucose-Capillary: 89 mg/dL (ref 70–99)
Glucose-Capillary: 92 mg/dL (ref 70–99)

## 2023-08-11 LAB — CBC WITH DIFFERENTIAL/PLATELET
Abs Immature Granulocytes: 0.01 10*3/uL (ref 0.00–0.07)
Basophils Absolute: 0 10*3/uL (ref 0.0–0.1)
Basophils Relative: 1 %
Eosinophils Absolute: 0.2 10*3/uL (ref 0.0–0.5)
Eosinophils Relative: 5 %
HCT: 29.6 % — ABNORMAL LOW (ref 36.0–46.0)
Hemoglobin: 9.4 g/dL — ABNORMAL LOW (ref 12.0–15.0)
Immature Granulocytes: 0 %
Lymphocytes Relative: 28 %
Lymphs Abs: 1.1 10*3/uL (ref 0.7–4.0)
MCH: 31.1 pg (ref 26.0–34.0)
MCHC: 31.8 g/dL (ref 30.0–36.0)
MCV: 98 fL (ref 80.0–100.0)
Monocytes Absolute: 0.4 10*3/uL (ref 0.1–1.0)
Monocytes Relative: 11 %
Neutro Abs: 2.2 10*3/uL (ref 1.7–7.7)
Neutrophils Relative %: 55 %
Platelets: 162 10*3/uL (ref 150–400)
RBC: 3.02 MIL/uL — ABNORMAL LOW (ref 3.87–5.11)
RDW: 14.7 % (ref 11.5–15.5)
WBC: 4 10*3/uL (ref 4.0–10.5)
nRBC: 0 % (ref 0.0–0.2)

## 2023-08-11 MED ORDER — ACETAMINOPHEN 325 MG PO TABS
650.0000 mg | ORAL_TABLET | Freq: Three times a day (TID) | ORAL | Status: DC
  Administered 2023-08-11 – 2023-08-14 (×9): 650 mg via ORAL
  Filled 2023-08-11 (×9): qty 2

## 2023-08-11 NOTE — Consult Note (Signed)
Consultation Note Date: 08/11/2023   Patient Name: Kathryn Bradford  DOB: October 22, 1930  MRN: [id number]  Age / Sex: 89 y.o., female  PCP: Sharmon Revere, MD Referring Physician: Janyth Pupa, MD  Reason for Consultation:   assistance with medical decision making    HPI/Patient Profile: 89 y.o. female  with past medical history of dementia, dHf, DM, PAD, PE on Eliquis, CKD, constipation, diverticulitis, herniaj, SBO, osteoarthritis,  admitted on 08/07/2023 with increasing weakness, confusion and decreased po intake. Workup reveals acute on chronic kidney injury. Cr on admission was 1.32 and GFR 38. Now improved to Cr 0.91 and GFR 60.  Palliative consulted for GOC.    Primary Decision Maker NEXT OF KIN daughter Porfirio Oar  Discussion: Chart reviewed including labs, progress notes, imaging from this and previous encounters.  On evaluation patient was sitting up in bed, awake, alert, enjoying her lunch of a hamburger and cole slaw. Had eaten about 50% on my eval.  She is unable to tell me she is in the hospital or why she is the hospital.  She complains of pain in her side and her knee. Very pleasant.  I called and spoke with her daughter Clide Cliff.  Emotional support provided as Laverne expressed the stressors of caring for her mom as her dementia progresses and her judgement is impaired.  She knows that her mother wishes to return home, however, she also knows that it is not safe for her mom to be living alone. Laverne's health has declined as a result of caring for her mom and she is unable to provide 24 hr care.  GOC are for patient to discharge to facility for rehab and eventually find long term or memory care placement.  We discussed strategies for coping with her mother's dementia and the personality and judgement struggles that brings.  Trajectory of dementia was discussed. Code status touched  on- patient has been adamant to remain full code. We discussed there may come a time when it is appropriate for Laverne to make the decision to change her code status to DNR.      SUMMARY OF RECOMMENDATIONS -Continue current plan of care- plan to discharge to SNF for rehab -Start acetaminophen 650mg  po TID for diffuse pains  -Order placed for referral to outpatient Palliative to see at SNF    Code Status/Advance Care Planning: Full code   Prognosis:   Unable to determine  Discharge Planning: Skilled Nursing Facility for rehab with Palliative care service follow-up  Primary Diagnoses: Present on Admission:  Hx of pulmonary embolus  AKI (acute kidney injury) (HCC)  Chronic constipation  Essential hypertension  CKD (chronic kidney disease), stage III (HCC)  Dementia (HCC)  Acute metabolic encephalopathy  Elevated troponin   Review of Systems  Musculoskeletal:  Positive for arthralgias.    Physical Exam Vitals and nursing note reviewed.  Cardiovascular:     Rate and Rhythm: Normal rate.  Pulmonary:     Effort: Pulmonary effort is normal.  Neurological:     Mental Status: She  is alert. She is disoriented.     Vital Signs: BP 137/70 (BP Location: Left Arm)   Pulse 85   Temp 99 F (37.2 C) (Oral)   Resp 20   Ht 5\' 5"  (1.651 m)   Wt 57 kg   SpO2 98%   BMI 20.91 kg/m  Pain Scale: 0-10 POSS *See Group Information*: 1-Acceptable,Awake and alert Pain Score: Asleep   SpO2: SpO2: 98 % O2 Device:SpO2: 98 % O2 Flow Rate: .   IO: Intake/output summary:  Intake/Output Summary (Last 24 hours) at 08/11/2023 1448 Last data filed at 08/11/2023 1028 Gross per 24 hour  Intake 360 ml  Output --  Net 360 ml    LBM: Last BM Date : 08/10/23 Baseline Weight: Weight: 57 kg Most recent weight: Weight: 57 kg       Thank you for this consult. Palliative medicine will continue to follow and assist as needed.  Time Total: 90 minutes Greater than 50%  of this time was spent  counseling and coordinating care related to the above assessment and plan.  Signed by: Ocie Bob, AGNP-C Palliative Medicine    Please contact Palliative Medicine Team phone at [phone] for questions and concerns.  For individual provider: See Loretha Stapler

## 2023-08-11 NOTE — Progress Notes (Signed)
PROGRESS NOTE  Kathryn Bradford  DOB: 1930-10-30  PCP: Sharmon Revere, MD VHQ:[id number]  DOA: 08/07/2023  LOS: 0 days  Hospital Day: 5  Brief narrative: Kathryn Bradford is a 89 y.o. female with PMH significant for dementia, DM2, HTN, HLD, aortic insufficiency, diastolic CHF, PAD, PE on Eliquis, CKD, constipation, diverticulitis, hernia, prior bowel obstruction who lives alone. 8/29, patient was brought to the ED by EMS from home for worsening confusion, constipation, generalized weakness, poor intake and hydration.  In the ED, patient was afebrile, bradycardic to 50s, blood pressure stable Labs with WBC count normal, hemoglobin 10.8 Patient/creatinine elevated 35/1.55 Troponin mildly elevated to 40s Procalcitonin level normal, TSH normal Resp virus panel unremarkable Urine is normal CT head showed chronic small vessel changes, no acute abnormality CT abdomen pelvis showed  1. Scarring and bronchitic changes in the lung bases. 2. Cholelithiasis without evidence of acute cholecystitis. 3. No evidence of bowel obstruction or inflammation. Ventral abdominal wall hernia contains small bowel and colon without evidence of proximal obstruction. 4. Prominent aortic atherosclerosis. 5. Colonic diverticulosis without evidence of acute diverticulitis.  Admitted to [facility]  Subjective: Patient was seen and examined this morning. Propped up in bed.  Awake.  Oriented x 3 Current care consult appreciated.  Assessment and plan: AKI on CKD stage IIIa Presented with poor intake, dehydration, creatinine elevated to 1.5.  Per Care Everywhere, last creatinine from May 2024 was 0.96 Creatinine improved with hydration. Recent Labs    08/07/23 1645 08/08/23 0050 08/09/23 0806 08/10/23 0711 08/11/23 0608  BUN 35* 26* 22 17 18   CREATININE 1.55* 1.32* 0.98 0.98 0.91   Acute metabolic encephalopathy Underlying dementia Presented with worsening lethargy secondary to dehydration.  No evidence of  infection.  CT head unremarkable.  No history of liver disease.  Ammonia level normal.   Neurological exam nonfocal.   Mental status gradually improved. Continue Aricept  Elevated troponin No complaint of chest pain.  Mildly elevated troponin likely due to dehydration. Echocardiogram showed EF of 65 to 70%, moderate LVH, grade 2 diastolic dysfunction.  H/o hypertension Sinus bradycardia PTA meds- Cardizem 180 mg daily Because of bradycardia and elevated blood pressure, Cardizem was switched to oral amlodipine.  Type 2 diabetes mellitus A1c 6.8 on 08/07/2023 continue Not on meds PTA. Continue SSI/Accu-Cheks Recent Labs  Lab 08/10/23 2048 08/11/23 0023 08/11/23 0506 08/11/23 0722 08/11/23 1158  GLUCAP 94 88 92 82 198*   H/o pulm embolism Continue Xarelto  Chronic constipation Continue bowel regimen  Mild chronic anemia Continue vitamin B12, folic acid level Recent Labs    08/07/23 1645 08/07/23 2317 08/08/23 0050 08/09/23 0806 08/10/23 0711 08/11/23 0608  HGB 10.8*  --  10.4* 9.6* 9.6* 9.4*  MCV 96.8  --  97.0 97.4 96.8 98.0  VITAMINB12  --   --  923*  --   --   --   FOLATE  --   --  11.4  --   --   --   FERRITIN  --  43  --   --   --   --   TIBC  --  262  --   --   --   --   IRON  --  51  --   --   --   --   RETICCTPCT 1.3  --   --   --   --   --    Impaired mobility  PT eval obtained.  SNF recommended.  Goals of care   Code Status:  Full Code     DVT prophylaxis:  SCDs Start: 08/07/23 2355 rivaroxaban (XARELTO) tablet 20 mg   Antimicrobials: Currently none Fluid: None Consultants: Palliative care Family Communication: Family not at bedside  Status: Observation Level of care:  Telemetry   Patient is from: Home Anticipated d/c to: Medically stable.  Pending SNF   Diet:  Diet Order             Diet regular Room service appropriate? Yes with Assist; Fluid consistency: Thin  Diet effective now                   Scheduled Meds:   acetaminophen  650 mg Oral TID   amLODipine  5 mg Oral Daily   donepezil  5 mg Oral QPM   feeding supplement  237 mL Oral BID BM   insulin aspart  0-9 Units Subcutaneous Q4H   latanoprost  1 drop Both Eyes QHS   polyethylene glycol  17 g Oral Daily   rivaroxaban  20 mg Oral Q supper    PRN meds: acetaminophen **OR** acetaminophen, HYDROcodone-acetaminophen, loratadine, ondansetron **OR** ondansetron (ZOFRAN) IV, traMADol   Infusions:     Antimicrobials: Anti-infectives (From admission, onward)    None       Nutritional status:  Body mass index is 20.91 kg/m.  Nutrition Problem: Moderate Malnutrition Etiology: chronic illness (dementia) Signs/Symptoms: mild fat depletion, moderate fat depletion     Objective: Vitals:   08/11/23 0508 08/11/23 1338  BP: (!) 173/68 137/70  Pulse: 60 85  Resp: 18 20  Temp: 97.6 F (36.4 C) 99 F (37.2 C)  SpO2: 98% 98%    Intake/Output Summary (Last 24 hours) at 08/11/2023 1554 Last data filed at 08/11/2023 1028 Gross per 24 hour  Intake 360 ml  Output --  Net 360 ml   Filed Weights   08/07/23 1514  Weight: 57 kg   Weight change:  Body mass index is 20.91 kg/m.   Physical Exam: General exam: Pleasant, elderly African-American female.  Not in physical distress Skin: No rashes, lesions or ulcers. HEENT: Atraumatic, normocephalic, no obvious bleeding Lungs: Clear to auscultation bilaterally CVS: Mild sinus bradycardia, regular rhythm, no murmur GI/Abd soft, nontender, nondistended, bowel sound present CNS: Alert, awake, oriented x 3.  Weak and slow to respond Psychiatry: Mood appropriate Extremities: No pedal edema, no calf tenderness  Data Review: I have personally reviewed the laboratory data and studies available.  F/u labs ordered Unresulted Labs (From admission, onward)     Start     Ordered   08/07/23 2025  Vitamin B1  Add-on,   AD        08/07/23 2024            Total time spent in review of labs and  imaging, patient evaluation, formulation of plan, documentation and communication with family: 25 minutes  Signed, Lorin Glass, MD [facility] 08/11/2023

## 2023-08-11 NOTE — Progress Notes (Signed)
Physical Therapy Treatment Patient Details Name: Kathryn Bradford MRN: [id number] DOB: 10-12-1930 Today's Date: 08/11/2023   History of Present Illness Patient is a 89 yo female presents to therapy following hospital admission on 08/07/2023 secondary to AMS, progressive weakness, abdominal pain and poor PO intake. Pt found to have mild AKI.  Pt PMH includes but is not limited to: SM II, SBO, aortic insufficiency, dCHF, peripheral arterial dz, HTN, HLD, PE, DVT, glaucoma, constipation and dementia.    PT Comments  Pt requesting assistance to bathroom. Min A to rise from toilet height, otherwise she was CGA/Supv level. Pt declined hallway ambulation at time of this session 2* knee pain. Assisted pt back to bed at her request. Will continue to follow and progress activity as tolerated. Per chart review, family is requesting ST SNF placement (pt lives alone). Pt could return home-would recommend increased supervision for safety.     If plan is discharge home, recommend the following: A little help with walking and/or transfers;A little help with bathing/dressing/bathroom;Assistance with cooking/housework;Help with stairs or ramp for entrance;Assist for transportation   Can travel by private vehicle        Equipment Recommendations  None recommended by PT    Recommendations for Other Services OT consult     Precautions / Restrictions Precautions Precautions: Fall Restrictions Weight Bearing Restrictions: No     Mobility  Bed Mobility Overal bed mobility: Needs Assistance Bed Mobility: Supine to Sit     Supine to sit: Supervision, HOB elevated     General bed mobility comments: Supv for safety. Increased time.    Transfers Overall transfer level: Needs assistance Equipment used: Rolling walker (2 wheels) Transfers: Sit to/from Stand Sit to Stand: Min assist           General transfer comment: CGA to rise from bed. Min A to rise from toilet height. Cues for safety, hand  placement. Increased time.    Ambulation/Gait Ambulation/Gait assistance: Contact guard assist Gait Distance (Feet): 15 Feet (x2) Assistive device: Rolling walker (2 wheels) Gait Pattern/deviations: Decreased stride length       General Gait Details: CGA for safety. No LOB with RW use. Slow gait speed. Pt declined further ambulation 2* knee pain.   Stairs             Wheelchair Mobility     Tilt Bed    Modified Rankin (Stroke Patients Only)       Balance Overall balance assessment: Needs assistance         Standing balance support: Bilateral upper extremity supported, During functional activity, Reliant on assistive device for balance Standing balance-Leahy Scale: Fair                              Cognition Arousal: Alert Behavior During Therapy: WFL for tasks assessed/performed Overall Cognitive Status: No family/caregiver present to determine baseline cognitive functioning                                 General Comments: intermittent cues provided (for ex: pt was about to walk away from toilet before pulling her underwear back up        Exercises      General Comments        Pertinent Vitals/Pain Pain Assessment Pain Assessment: Faces Faces Pain Scale: Hurts a little bit Pain Location: L lower back Pain Descriptors / Indicators: Discomfort  Pain Intervention(s): Monitored during session, Limited activity within patient's tolerance    Home Living                          Prior Function            PT Goals (current goals can now be found in the care plan section) Progress towards PT goals: Progressing toward goals    Frequency    Min 1X/week      PT Plan      Co-evaluation              AM-PAC PT "6 Clicks" Mobility   Outcome Measure  Help needed turning from your back to your side while in a flat bed without using bedrails?: None Help needed moving from lying on your back to sitting  on the side of a flat bed without using bedrails?: None Help needed moving to and from a bed to a chair (including a wheelchair)?: A Little Help needed standing up from a chair using your arms (e.g., wheelchair or bedside chair)?: A Little Help needed to walk in hospital room?: A Little Help needed climbing 3-5 steps with a railing? : A Lot 6 Click Score: 19    End of Session Equipment Utilized During Treatment: Gait belt Activity Tolerance: Patient tolerated treatment well;Patient limited by pain Patient left: with call bell/phone within reach;with bed alarm set   PT Visit Diagnosis: Unsteadiness on feet (R26.81);Muscle weakness (generalized) (M62.81);Pain;Difficulty in walking, not elsewhere classified (R26.2) Pain - part of body: Knee     Time: 9518-8416 PT Time Calculation (min) (ACUTE ONLY): 21 min  Charges:    $Gait Training: 8-22 mins PT General Charges $$ ACUTE PT VISIT: 1 Visit                         Faye Ramsay, PT Acute Rehabilitation  Office: [phone]

## 2023-08-12 DIAGNOSIS — N179 Acute kidney failure, unspecified: Secondary | ICD-10-CM | POA: Diagnosis not present

## 2023-08-12 LAB — GLUCOSE, CAPILLARY
Glucose-Capillary: 95 mg/dL (ref 70–99)
Glucose-Capillary: 98 mg/dL (ref 70–99)
Glucose-Capillary: 163 mg/dL — ABNORMAL HIGH (ref 70–99)
Glucose-Capillary: 146 mg/dL — ABNORMAL HIGH (ref 70–99)
Glucose-Capillary: 144 mg/dL — ABNORMAL HIGH (ref 70–99)

## 2023-08-12 MED ORDER — ALUM & MAG HYDROXIDE-SIMETH 200-200-20 MG/5ML PO SUSP
15.0000 mL | Freq: Four times a day (QID) | ORAL | Status: DC | PRN
  Administered 2023-08-12: 15 mL via ORAL
  Filled 2023-08-12: qty 30

## 2023-08-12 NOTE — Progress Notes (Signed)
Occupational Therapy Treatment Patient Details Name: Kathryn Bradford MRN: [id number] DOB: October 26, 1930 Today's Date: 08/12/2023   History of present illness Patient is a 89 yo female presents to therapy following hospital admission on 08/07/2023 secondary to AMS, progressive weakness, abdominal pain and poor PO intake. Pt found to have mild AKI.  Pt PMH includes but is not limited to: SM II, SBO, aortic insufficiency, dCHF, peripheral arterial dz, HTN, HLD, PE, DVT, glaucoma, constipation and dementia.   OT comments  Patient was educated on importance of getting up to the bathroom more frequently and using appropriate products for hygiene with toileting. Patient and NT verbalized understanding. Recommend for patient to not use pure wic to prevent learned helplessness and maintain functional activity tolerance. Nursing made aware. Patient will need 24/7 caregiver support in next level of care. Patient's discharge plan remains appropriate at this time. OT will continue to follow acutely.        If plan is discharge home, recommend the following:  A little help with bathing/dressing/bathroom;Assistance with cooking/housework;Direct supervision/assist for medications management;Direct supervision/assist for financial management;Assist for transportation;Supervision due to cognitive status   Equipment Recommendations  None recommended by OT       Precautions / Restrictions Precautions Precautions: Fall Restrictions Weight Bearing Restrictions: No       Mobility Bed Mobility Overal bed mobility: Needs Assistance Bed Mobility: Supine to Sit     Supine to sit: Supervision, HOB elevated     General bed mobility comments: Supv for safety. Increased time.         Balance Overall balance assessment: Needs assistance Sitting-balance support: Feet supported Sitting balance-Leahy Scale: Good     Standing balance support: Bilateral upper extremity supported, During functional activity,  Reliant on assistive device for balance Standing balance-Leahy Scale: Fair                             ADL either performed or assessed with clinical judgement   ADL Overall ADL's : Needs assistance/impaired           General ADL Comments: patient was asking for three paper towels to place in underwear around pads. patient was educated that pads were used to soak up dripping but better strategy was going to bathroom more often. patient verbalized understanding but continued to report she likes it her way. NT consulted on checking on patient and encouragemnet to go to bathroom. NT verbalized understanding. NT and patient educated on not using pure wic as patient is a HIGH risk for learned helplessness and was able to transfer to bathroom with CGA and RW on this date.      Cognition Arousal: Alert Behavior During Therapy: WFL for tasks assessed/performed Overall Cognitive Status: No family/caregiver present to determine baseline cognitive functioning       General Comments: patient needs consistent safety cues with patient having high risk of learned helplessness in this environment.                   Pertinent Vitals/ Pain       Pain Assessment Pain Assessment: Faces Faces Pain Scale: Hurts a little bit Pain Location: L lower back Pain Descriptors / Indicators: Discomfort Pain Intervention(s): Limited activity within patient's tolerance, Monitored during session         Frequency  Min 1X/week        Progress Toward Goals  OT Goals(current goals can now be found in the care plan section)  Progress towards OT goals: Progressing toward goals     Plan         AM-PAC OT "6 Clicks" Daily Activity     Outcome Measure   Help from another person eating meals?: None Help from another person taking care of personal grooming?: A Little Help from another person toileting, which includes using toliet, bedpan, or urinal?: A Little Help from another person  bathing (including washing, rinsing, drying)?: A Little Help from another person to put on and taking off regular upper body clothing?: A Little Help from another person to put on and taking off regular lower body clothing?: A Little 6 Click Score: 19    End of Session Equipment Utilized During Treatment: Rolling walker (2 wheels);Other (comment);Gait belt (BSC)  OT Visit Diagnosis: Unsteadiness on feet (R26.81);Other abnormalities of gait and mobility (R26.89);Muscle weakness (generalized) (M62.81);Other symptoms and signs involving cognitive function   Activity Tolerance Patient tolerated treatment well   Patient Left in chair;with call bell/phone within reach;with chair alarm set   Nurse Communication Mobility status        Time: 2956-2130 OT Time Calculation (min): 21 min  Charges: OT General Charges $OT Visit: 1 Visit OT Treatments $Self Care/Home Management : 8-22 mins  Rosalio Loud, MS Acute Rehabilitation Department Office# [phone]   Selinda Flavin 08/12/2023, 4:16 PM

## 2023-08-12 NOTE — TOC Progression Note (Addendum)
Transition of Care [facility]) - Progression Note    Patient Details  Name: Kathryn Bradford MRN: [id number] Date of Birth: 1930/07/19  Transition of [facility]) CM/SW Contact  Beckie Busing, RN Phone Number:[phone]  08/12/2023, 2:03 PM  Clinical Narrative:    CM called daughter Kathryn Bradford [phone]) to offer bed choices. Daughter would like to pursue bed offer from [employer] [employer] [facility]. CM at bedside to make patient aware of bed choice. Patient states that she is not going to SNF for rehab. CM has updated daughter. CM has updated MD. Daughter states that she has HCPOA but is looking for the paperwork. CM has also asked MD if this patient has capacity to make her own decisions. MD states that he will follow up with daughter. TOC will continue to follow.   1458 CM has reached out to [facility] supervisor for assistance to determine how TOC can move forward with disposition planning. At this time TOC is not able to move forward with SNF placement against the patients will. There will need to be documentation that patient lacks capacity in order to allow daughter to make decisions.   [employer] has been initiated in preparation for disposition planning Auth ID [id number]   Expected Discharge Plan: Skilled Nursing Facility Barriers to Discharge: Continued Medical Work up  Expected Discharge Plan and Services In-house Referral: Clinical Social Work     Living arrangements for the past 2 months: Apartment                                       Social Determinants of Health (SDOH) Interventions SDOH Screenings   Food Insecurity: No Food Insecurity (08/08/2023)  Housing: Low Risk  (08/08/2023)  Transportation Needs: No Transportation Needs (08/08/2023)  Utilities: Not At Risk (08/08/2023)  Alcohol Screen: Low Risk  (08/08/2023)  Depression (PHQ2-9): Low Risk  (08/08/2023)  Financial Resource Strain: Low Risk  (08/08/2023)  Physical Activity: Inactive (08/08/2023)  Social Connections:  Moderately Integrated (08/08/2023)  Stress: No Stress Concern Present (08/08/2023)  Tobacco Use: High Risk (05/01/2023)   Received from [facility]  Health Literacy: Adequate Health Literacy (08/08/2023)    Readmission Risk Interventions     No data to display

## 2023-08-12 NOTE — Plan of Care (Signed)

## 2023-08-12 NOTE — Progress Notes (Addendum)
PROGRESS NOTE  Kathryn Bradford  DOB: 08/30/1930  PCP: Sharmon Revere, MD UXL:[id number]  DOA: 08/07/2023  LOS: 0 days  Hospital Day: 6  Brief narrative: Kathryn Bradford is a 89 y.o. female with PMH significant for dementia, DM2, HTN, HLD, aortic insufficiency, diastolic CHF, PAD, PE on Eliquis, CKD, constipation, diverticulitis, hernia, prior bowel obstruction who lives alone. 8/29, patient was brought to the ED by EMS from home for worsening confusion, constipation, generalized weakness, poor intake and hydration.  In the ED, patient was afebrile, bradycardic to 50s, blood pressure stable Labs with WBC count normal, hemoglobin 10.8 Patient/creatinine elevated 35/1.55 Troponin mildly elevated to 40s Procalcitonin level normal, TSH normal Resp virus panel unremarkable Urine is normal CT head showed chronic small vessel changes, no acute abnormality CT abdomen pelvis showed  1. Scarring and bronchitic changes in the lung bases. 2. Cholelithiasis without evidence of acute cholecystitis. 3. No evidence of bowel obstruction or inflammation. Ventral abdominal wall hernia contains small bowel and colon without evidence of proximal obstruction. 4. Prominent aortic atherosclerosis. 5. Colonic diverticulosis without evidence of acute diverticulitis.  Admitted to [facility]  Subjective: Patient was seen and examined this morning. Propped up in bed. Weak.  Slow to respond and intermittently confused but able to answer orientation questions PT recommended rehab but patient wants to go home. I had a long conversation with the daughter.  She is 62 years old and has her own issues.  She reports her mom has progressive dementia for months and have not been able to take care of herself but has always been refusing to go to a nursing facility.  Daughter also states that patient no longer has an apartment to go back to.  Assessment and plan: AKI on CKD stage IIIa Presented with poor intake, dehydration,  creatinine elevated to 1.5.  Per Care Everywhere, last creatinine from May 2024 was 0.96 Creatinine improved with hydration. Recent Labs    08/07/23 1645 08/08/23 0050 08/09/23 0806 08/10/23 0711 08/11/23 0608  BUN 35* 26* 22 17 18   CREATININE 1.55* 1.32* 0.98 0.98 0.91   Acute metabolic encephalopathy Underlying dementia Presented with worsening lethargy secondary to dehydration.  No evidence of infection.  CT head unremarkable.  No history of liver disease.  Ammonia level normal.   Neurological exam nonfocal.   Mental status gradually improved. Continue Aricept  Elevated troponin No complaint of chest pain.  Mildly elevated troponin likely due to dehydration. Echocardiogram showed EF of 65 to 70%, moderate LVH, grade 2 diastolic dysfunction.  H/o hypertension Sinus bradycardia PTA meds- Cardizem 180 mg daily Because of bradycardia and elevated blood pressure, Cardizem was switched to oral amlodipine.  Type 2 diabetes mellitus A1c 6.8 on 08/07/2023 continue Not on meds PTA. Continue SSI/Accu-Cheks Recent Labs  Lab 08/11/23 2001 08/11/23 2345 08/12/23 0420 08/12/23 0741 08/12/23 1109  GLUCAP 84 89 95 98 163*   H/o pulm embolism Continue Xarelto  Chronic constipation Continue bowel regimen  Mild chronic anemia Continue vitamin B12, folic acid level Recent Labs    08/07/23 1645 08/07/23 2317 08/08/23 0050 08/09/23 0806 08/10/23 0711 08/11/23 0608  HGB 10.8*  --  10.4* 9.6* 9.6* 9.4*  MCV 96.8  --  97.0 97.4 96.8 98.0  VITAMINB12  --   --  923*  --   --   --   FOLATE  --   --  11.4  --   --   --   FERRITIN  --  43  --   --   --   --  TIBC  --  262  --   --   --   --   IRON  --  51  --   --   --   --   RETICCTPCT 1.3  --   --   --   --   --    Impaired mobility  PT eval obtained.  SNF recommended.  Goals of care   Code Status: Full Code     DVT prophylaxis:  SCDs Start: 08/07/23 2355 rivaroxaban (XARELTO) tablet 20 mg   Antimicrobials:  Currently none Fluid: None Consultants: Palliative care Family Communication: Family not at bedside.  I called and spoke to patient's daughter Ms. Samuel Bouche.  Status: Observation Level of care:  Telemetry   Patient is from: Home Anticipated d/c to: Medically stable.  Pending SNF   Diet:  Diet Order             Diet regular Room service appropriate? Yes with Assist; Fluid consistency: Thin  Diet effective now                   Scheduled Meds:  acetaminophen  650 mg Oral TID   amLODipine  5 mg Oral Daily   donepezil  5 mg Oral QPM   feeding supplement  237 mL Oral BID BM   insulin aspart  0-9 Units Subcutaneous Q4H   latanoprost  1 drop Both Eyes QHS   polyethylene glycol  17 g Oral Daily   rivaroxaban  20 mg Oral Q supper    PRN meds: acetaminophen **OR** acetaminophen, alum & mag hydroxide-simeth, HYDROcodone-acetaminophen, loratadine, ondansetron **OR** ondansetron (ZOFRAN) IV, traMADol   Infusions:     Antimicrobials: Anti-infectives (From admission, onward)    None       Nutritional status:  Body mass index is 20.91 kg/m.  Nutrition Problem: Moderate Malnutrition Etiology: chronic illness (dementia) Signs/Symptoms: mild fat depletion, moderate fat depletion     Objective: Vitals:   08/12/23 0433 08/12/23 1404  BP: (!) 172/86 (!) 148/80  Pulse: 68 76  Resp: 18 18  Temp: 97.7 F (36.5 C) 97.9 F (36.6 C)  SpO2: 100% 100%    Intake/Output Summary (Last 24 hours) at 08/12/2023 1442 Last data filed at 08/12/2023 1220 Gross per 24 hour  Intake 600 ml  Output 100 ml  Net 500 ml   Filed Weights   08/07/23 1514  Weight: 57 kg   Weight change:  Body mass index is 20.91 kg/m.   Physical Exam: General exam: Pleasant, elderly African-American female.  Not in physical distress Skin: No rashes, lesions or ulcers. HEENT: Atraumatic, normocephalic, no obvious bleeding Lungs: Clear to auscultate bilaterally CVS: Mild sinus bradycardia, regular  rhythm, no murmur GI/Abd soft, nontender, nondistended, bowel sound present CNS: Alert, awake.  Oriented to place and person.  Weak.  Slow to respond Psychiatry: Mood appropriate Extremities: No pedal edema, no calf tenderness  Data Review: I have personally reviewed the laboratory data and studies available.  F/u labs ordered Unresulted Labs (From admission, onward)     Start     Ordered   08/07/23 2025  Vitamin B1  Add-on,   AD        08/07/23 2024            Total time spent in review of labs and imaging, patient evaluation, formulation of plan, documentation and communication with family: 25 minutes  Signed, Lorin Glass, MD [facility] 08/12/2023

## 2023-08-13 DIAGNOSIS — N179 Acute kidney failure, unspecified: Secondary | ICD-10-CM | POA: Diagnosis not present

## 2023-08-13 LAB — GLUCOSE, CAPILLARY
Glucose-Capillary: 105 mg/dL — ABNORMAL HIGH (ref 70–99)
Glucose-Capillary: 149 mg/dL — ABNORMAL HIGH (ref 70–99)
Glucose-Capillary: 216 mg/dL — ABNORMAL HIGH (ref 70–99)
Glucose-Capillary: 74 mg/dL (ref 70–99)
Glucose-Capillary: 78 mg/dL (ref 70–99)
Glucose-Capillary: 98 mg/dL (ref 70–99)

## 2023-08-13 MED ORDER — AMLODIPINE BESYLATE 5 MG PO TABS: 5.0000 mg | ORAL_TABLET | Freq: Every day | ORAL | Status: AC

## 2023-08-13 MED ORDER — ACETAMINOPHEN 325 MG PO TABS: 650.0000 mg | ORAL_TABLET | Freq: Three times a day (TID) | ORAL | Status: AC

## 2023-08-13 MED ORDER — TRAMADOL HCL 50 MG PO TABS: 50.0000 mg | ORAL_TABLET | Freq: Two times a day (BID) | ORAL | 0 refills | Status: AC | PRN

## 2023-08-13 NOTE — Progress Notes (Signed)
PROGRESS NOTE  Kathryn Bradford  DOB: [birth year]  PCP: Sharmon Revere, MD ZOX:[id number]  DOA: 08/07/2023  LOS: 0 days  Hospital Day: 7  Brief narrative: Kathryn Bradford is a 89 y.o. female with PMH significant for dementia, DM2, HTN, HLD, aortic insufficiency, diastolic CHF, PAD, PE on Eliquis, CKD, constipation, diverticulitis, hernia, prior bowel obstruction who lives alone. 8/29, patient was brought to the ED by EMS from home for worsening confusion, constipation, generalized weakness, poor intake and hydration.  In the ED, patient was afebrile, bradycardic to 50s, blood pressure stable Labs with WBC count normal, hemoglobin 10.8 Patient/creatinine elevated 35/1.55 Troponin mildly elevated to 40s Procalcitonin level normal, TSH normal Resp virus panel unremarkable Urine is normal CT head showed chronic small vessel changes, no acute abnormality CT abdomen pelvis showed  1. Scarring and bronchitic changes in the lung bases. 2. Cholelithiasis without evidence of acute cholecystitis. 3. No evidence of bowel obstruction or inflammation. Ventral abdominal wall hernia contains small bowel and colon without evidence of proximal obstruction. 4. Prominent aortic atherosclerosis. 5. Colonic diverticulosis without evidence of acute diverticulitis.  Admitted to [facility]  Subjective: Patient was seen and examined this morning. Patient was on the phone with her daughter and I engaged in the conversation as well. Patient continues to refuse to go to SNF but daughter keeps on repeating that patient agreed with her earlier to go to SNF. Pending psych evaluation  Assessment and plan: AKI on CKD stage IIIa Presented with poor intake, dehydration, creatinine elevated to 1.5.  Per Care Everywhere, last creatinine from May 2024 was 0.96 Creatinine improved with hydration. Recent Labs    08/07/23 1645 08/08/23 0050 08/09/23 0806 08/10/23 0711 08/11/23 0608  BUN 35* 26* 22 17 18   CREATININE  1.55* 1.32* 0.98 0.98 0.91   Acute metabolic encephalopathy Underlying dementia Presented with worsening lethargy secondary to dehydration.  No evidence of infection.  CT head unremarkable.  No history of liver disease.  Ammonia level normal.   Neurological exam nonfocal.   Mental status gradually improved. Continue Aricept  Elevated troponin No complaint of chest pain.  Mildly elevated troponin likely due to dehydration. Echocardiogram showed EF of 65 to 70%, moderate LVH, grade 2 diastolic dysfunction.  H/o hypertension Sinus bradycardia PTA meds- Cardizem 180 mg daily Because of bradycardia and elevated blood pressure, Cardizem was switched to oral amlodipine.  Type 2 diabetes mellitus A1c 6.8 on 08/07/2023 continue Not on meds PTA. Continue SSI/Accu-Cheks Recent Labs  Lab 08/12/23 1626 08/12/23 2028 08/13/23 0021 08/13/23 0418 08/13/23 0751  GLUCAP 146* 144* 78 105* 98   H/o pulm embolism Continue Xarelto  Chronic constipation Continue bowel regimen  Mild chronic anemia Continue vitamin B12, folic acid level Recent Labs    08/07/23 1645 08/07/23 2317 08/08/23 0050 08/09/23 0806 08/10/23 0711 08/11/23 0608  HGB 10.8*  --  10.4* 9.6* 9.6* 9.4*  MCV 96.8  --  97.0 97.4 96.8 98.0  VITAMINB12  --   --  923*  --   --   --   FOLATE  --   --  11.4  --   --   --   FERRITIN  --  43  --   --   --   --   TIBC  --  262  --   --   --   --   IRON  --  51  --   --   --   --   RETICCTPCT 1.3  --   --   --   --   --  Impaired mobility  PT eval obtained.  SNF recommended.  Goals of care   Code Status: Full Code     DVT prophylaxis:  SCDs Start: 08/07/23 2355 rivaroxaban (XARELTO) tablet 20 mg   Antimicrobials: Currently none Fluid: None Consultants: Palliative care Family Communication: Family not at bedside.  I called and spoke to patient's daughter Ms. Lucas from bedside this morning  Status: Observation Level of care:  Telemetry   Patient is from:  Home Anticipated d/c to: Medically stable.  Pending psych evaluation for decision-making capacity   Diet:  Diet Order             Diet regular Room service appropriate? Yes with Assist; Fluid consistency: Thin  Diet effective now                   Scheduled Meds:  acetaminophen  650 mg Oral TID   amLODipine  5 mg Oral Daily   donepezil  5 mg Oral QPM   feeding supplement  237 mL Oral BID BM   insulin aspart  0-9 Units Subcutaneous Q4H   latanoprost  1 drop Both Eyes QHS   polyethylene glycol  17 g Oral Daily   rivaroxaban  20 mg Oral Q supper    PRN meds: acetaminophen **OR** acetaminophen, alum & mag hydroxide-simeth, HYDROcodone-acetaminophen, loratadine, ondansetron **OR** ondansetron (ZOFRAN) IV, traMADol   Infusions:     Antimicrobials: Anti-infectives (From admission, onward)    None       Nutritional status:  Body mass index is 20.91 kg/m.  Nutrition Problem: Moderate Malnutrition Etiology: chronic illness (dementia) Signs/Symptoms: mild fat depletion, moderate fat depletion     Objective: Vitals:   08/12/23 2026 08/13/23 0415  BP: (!) 148/77 135/74  Pulse: 87 68  Resp: 16 14  Temp: 99.3 F (37.4 C) 97.8 F (36.6 C)  SpO2: 98% 99%    Intake/Output Summary (Last 24 hours) at 08/13/2023 1001 Last data filed at 08/13/2023 0951 Gross per 24 hour  Intake 480 ml  Output 100 ml  Net 380 ml   Filed Weights   08/07/23 1514  Weight: 57 kg   Weight change:  Body mass index is 20.91 kg/m.   Physical Exam: General exam: Pleasant, elderly African-American female.  Not in physical distress Skin: No rashes, lesions or ulcers. HEENT: Atraumatic, normocephalic, no obvious bleeding Lungs: Clear to auscultation bilaterally CVS: Mild sinus bradycardia, regular rhythm, no murmur GI/Abd soft, nontender, nondistended, bowel sound present CNS: Alert, awake.  Oriented to place and person.  Weak.  Slow to respond Psychiatry: Stressed after talking to  her daughter Extremities: No pedal edema, no calf tenderness  Data Review: I have personally reviewed the laboratory data and studies available.  F/u labs ordered Unresulted Labs (From admission, onward)     Start     Ordered   08/07/23 2025  Vitamin B1  Add-on,   AD        08/07/23 2024            Total time spent in review of labs and imaging, patient evaluation, formulation of plan, documentation and communication with family: 25 minutes  Signed, Lorin Glass, MD [facility] 08/13/2023

## 2023-08-13 NOTE — TOC Progression Note (Addendum)
Transition of Care [facility]) - Progression Note    Patient Details  Name: Kathryn Bradford MRN: [id number] Date of Birth: [birth year]  Transition [facility] CM/SW Contact  Bryten Maher [place], [place] Phone Number: 08/13/2023, 1:10 PM  Clinical Narrative:     Per psychiatry pt has capacity to make her own decisions. CSW met with pt to discuss disposition. Pt expresses a clear understanding of her situation and her desires. She is agreeable for short term rehab at [facility] but wants to return to her apartment after rehab. She reiterates she does not want to go to SNF for long term care; only for short term rehab.  CSW calls [facility] who confirms they could admit pt for rehab and that decision for LTC would be up to pt/family.   CSW calls pt's daughter on speaker phone to discuss with pt present. Daughter yells and speaks over CSW and pt. She repeatedly states that pt is going to the SNF and that she is power of attorney. CSW explained that pt has been determined to have capacity to make her own decisions and that pt is only agreeable to SNF for rehab. Daughter continues to be argumentative and repeatedly interrupts CSW. She then states she already "signed off" on pt's apartment and has not paid rent explaining that pt would not be able to return home. At this point CSW made efforts to end phone call.   CSW spoke with pt after phone call with daughter. Pt is  worried that she would have no where to go after rehab. She states she never agreed to end lease on her apartment. She also states she never signed any POA paperwork. There is no POA paperwork documented in pt's chart either.   CSW informed TOC leadership. Plan will be to make APS. Pt to DC to [facility] for short term rehab.   1330: CSW discussed situation further with pt. She reiterated that she did not agree to end her lease at her apartment. Pt is agreeable with  plan to [facility] with plan for APS report to be made with  hopes they can assist with keeping her apartment.  [facility] is approved 9/4-- 9/6 Auth# [id number]  CSW notified MD and [facility] Liaison. [facility] liaison will call daughter to go over paperwork.   1430: CSW informed by [facility] Liaison that daughter cannot complete paperwork until tomorrow at 130p. [facility] will have to admit pt tomorrow. MD and RN made aware.  [facility] is also aware of need for OP Palliative at DC and they will arrange that.    Expected Discharge Plan: Skilled Nursing Facility Barriers to Discharge: Family Issues  Expected Discharge Plan and Services In-house Referral: Clinical Social Work     Living arrangements for the past 2 months: Apartment                                       Social Determinants of Health (SDOH) Interventions SDOH Screenings   Food Insecurity: No Food Insecurity (08/08/2023)  Housing: Low Risk  (08/08/2023)  Transportation Needs: No Transportation Needs (08/08/2023)  Utilities: Not At Risk (08/08/2023)  Alcohol Screen: Low Risk  (08/08/2023)  Depression (PHQ2-9): Low Risk  (08/08/2023)  Financial Resource Strain: Low Risk  (08/08/2023)  Physical Activity: Inactive (08/08/2023)  Social Connections: Moderately Integrated (08/08/2023)  Stress: No Stress Concern Present (08/08/2023)  Tobacco Use: High Risk (05/01/2023)   Received from [facility]  Health Literacy: Adequate Health Literacy (08/08/2023)    Readmission Risk Interventions     No data to display

## 2023-08-13 NOTE — Consult Note (Addendum)
Kathryn Bradford is a 89 y.o. female with PMH significant for dementia, DM2, HTN, HLD, aortic insufficiency, diastolic CHF, PAD, PE on Eliquis, CKD, constipation, diverticulitis, hernia, prior bowel obstruction who lives alone. 8/29, patient was brought to the ED by EMS from home for worsening confusion, constipation, generalized weakness, poor intake and hydration. Psych consult placed for capacity evaluation for discharge. Patient has no previous psychiatric history. She has a diagnosis of dementia, currently being treated for acute metabolic encephalopathy, chronic kidney disease none of which have impacted her decision making capabilities on this evaluation. She denies any suicidal ideations at this time, and does not appear to be a danger to self or others.     Decision being assessed:ability to discharge In an evaluation of capacity, each of the following criteria must be met based for a patient to have capacity to make the decision in question.   Criterion 1: The patient demonstrates a clear and consistent voluntary choice with regard to treatment options. Yes Criterion 2: The patient adequately understands the disease they have, the treatment proposed, the risks of treatment, and the risks of other treatment (including no treatment). Yes Criterion 3: The patient acknowledges that the details of Criterion 2 apply to them specifically and the likely consequences of treatment options proposed. Yes Criterion 4: The patient demonstrates adequate reasoning/rationality within the context of their decision and can provide justification for their choice. Yes  In this case, the patient Kathryn Bradford DOES have capacity to decide to discharge.   See patient interview below for details. Of note, this capacity evaluation assesses only for the specified decision documented above at the time of the assessment and is not a substitute for determination of the patient's overall competency, which can only be  adjudicated.   Attempted to speak with Daughter Kathryn Bradford for collateral and discuss findings. Number [id number]- has privacy blocker, error calling.   Mental Capacity Assessment: I have evaluated the following areas to assess the Kathryn Bradford's mental capacity regarding medical decision-making ability which pertains to competency to discharge.   The specific treatment or service in question is: History of Dementia requesting discharge home vs SNF.  Communication: The patient was able to clearly state preferred treatment options to include home vs snf. She is able to communicate that she is at [facility] for arthritic pain and limited mobility.She states arthritis runs in the family and she has managed this pain for years and recently had limitations with hands, and shortness of breath that resulted in her admission. She states they are treating her pain, and applying bandages in which she feels better now and Is ready to go.  Factors that could compromise this communication process include:dementia.  Regarding her treatment " my daughter wants me to go to that place nursing home. I can go home. I do not want to lose my apartment. She is worried about me and my strength she can barely walk her self." .   Understanding: The patient was able to recall information, link causal relationships, and process general probabilities regarding life situations and medical treatment scenarios "The pain in my hand feels much better now. I can open things. "  She was able to paraphrase her view of the current situation and her thoughts about it.  She is future oriented and able to provide any related scenarios regarding understanding of current proposed treatment, consequences related to refusal of treatment, and unsafe disposition.. The patient did NOT present with impairments in memory, attention span, or intelligence. 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 I can take care  of myself just as good as them. I have a wheelchair and walker at home. Most of the time I use the wheelchair, and I am very careful at home. If something were to happen, I would unlock the door if I can and use my emergency button. Im 89 years old, they might find me dead. "   Appreciation: The patient was able to identify and describe her various illnesses and treatment options with potential outcomes. The patient did present with concerns such as denial or delusional thought-process.  Patient did deny previous psychiatric history.  SHe is ruminating about losing her apartment while in the nursing home for 2-3 weeks, and this is very distressing to her. " I get lonely at home, and if I go to the nursing home. I will have some people to talk too, but I rely on God. I want to stay in my apartment and take care of me. I cook, clean, bathe, dust and mop all by myself, more than what my daughter does. " She confirms that she lives alone, but daughter does have key to her apartment.   Rationalization: The patient was able to weigh risks and benefits and come to a conclusion congruent with patient's perceived goals. Concerns regarding this category are: dementia.   In conclusion, the patient is-not experiencing an acute medical scenario. Currently stable, asking this provider to find spectrum news on the TV.   Conclusion: At this time, there is NOT T sufficient evidence to warrant removal of the patient's rights for medical decision-making as it pertains to discharging home.  She can clearly determine mental capacity for decision-making in the setting of dementia(Mild cognitive impairment).  At this time, we can determine that the patient does have functional mental capacity for medical decision-making including the right to discharge home vs SNF.    Psych consult was placed for mental capacity, as patient  has dementia and wanting to discharge home. SHe is alert and oreinted x 4, calm and cooperative, very  articulate and does not demonstrate difficulty with orientation, memory recall.  In terms of decision making capacity, patient has insight, appreciation, understanding, rationalization, communication, consequences, and is able to link causal relationships. This is evident by her ability to weigh her current physical limitations (arthritis , emergency button, home health) housing (lose my apartment and be stuck), and need for physical therapy services (wheelchair). Therefore she has capacity as she is able to appreciate the effects of treatment and her need for ongoing services.     Psychiatry consult service to sign off at this time.  Thank you for this capacity consult.

## 2023-08-13 NOTE — TOC Progression Note (Signed)
Transition of Care [facility]) - Progression Note    Patient Details  Name: Kathryn Bradford MRN: [id number] Date of Birth: 06-Aug-1930  Transition of Care [facility]) CM/SW Contact  Erin Sons, [place] Phone Number: 08/13/2023, 9:47 AM  Clinical Narrative:     SNF [facility] is still pending at this time. TOC will continue to follow.   Expected Discharge Plan: Skilled Nursing Facility Barriers to Discharge: Continued Medical Work up  Expected Discharge Plan and Services In-house Referral: Clinical Social Work     Living arrangements for the past 2 months: Apartment                                       Social Determinants of Health (SDOH) Interventions SDOH Screenings   Food Insecurity: No Food Insecurity (08/08/2023)  Housing: Low Risk  (08/08/2023)  Transportation Needs: No Transportation Needs (08/08/2023)  Utilities: Not At Risk (08/08/2023)  Alcohol Screen: Low Risk  (08/08/2023)  Depression (PHQ2-9): Low Risk  (08/08/2023)  Financial Resource Strain: Low Risk  (08/08/2023)  Physical Activity: Inactive (08/08/2023)  Social Connections: Moderately Integrated (08/08/2023)  Stress: No Stress Concern Present (08/08/2023)  Tobacco Use: High Risk (05/01/2023)   Received from [facility]  Health Literacy: Adequate Health Literacy (08/08/2023)    Readmission Risk Interventions     No data to display

## 2023-08-13 NOTE — Discharge Summary (Signed)
Physician Discharge Summary  Kathryn Bradford UJW:[id number] DOB: [birth year] DOA: 08/07/2023  PCP: Sharmon Revere, MD  Admit date: 08/07/2023 Discharge date: 08/14/2023  Admitted From: Home Discharge disposition: SNF  Brief narrative: Kathryn Bradford is a 89 y.o. female with PMH significant for dementia, DM2, HTN, HLD, aortic insufficiency, diastolic CHF, PAD, PE on Eliquis, CKD, constipation, diverticulitis, hernia, prior bowel obstruction who lives alone. 8/29, patient was brought to the ED by EMS from home for worsening confusion, constipation, generalized weakness, poor intake and hydration.  In the ED, patient was afebrile, bradycardic to 50s, blood pressure stable Labs with WBC count normal, hemoglobin 10.8 Patient/creatinine elevated 35/1.55 Troponin mildly elevated to 40s Procalcitonin level normal, TSH normal Resp virus panel unremarkable Urine is normal CT head showed chronic small vessel changes, no acute abnormality CT abdomen pelvis showed  1. Scarring and bronchitic changes in the lung bases. 2. Cholelithiasis without evidence of acute cholecystitis. 3. No evidence of bowel obstruction or inflammation. Ventral abdominal wall hernia contains small bowel and colon without evidence of proximal obstruction. 4. Prominent aortic atherosclerosis. 5. Colonic diverticulosis without evidence of acute diverticulitis.  Admitted to [facility]  Subjective: Patient was seen and examined this morning. Patient was on the phone with her daughter and I engaged in the conversation as well. Psych consultation was obtained.  Patient has capacity to make her decisions Patient agrees to go to SNF for rehab today  Hospital course: AKI on CKD stage IIIa Presented with poor intake, dehydration, creatinine elevated to 1.5.  Per Care Everywhere, last creatinine from May 2024 was 0.96 Creatinine improved with hydration. Recent Labs    08/07/23 1645 08/08/23 0050 08/09/23 0806 08/10/23 0711  08/11/23 0608  BUN 35* 26* 22 17 18   CREATININE 1.55* 1.32* 0.98 0.98 0.91   Acute metabolic encephalopathy Underlying dementia Presented with worsening lethargy secondary to dehydration.  No evidence of infection.  CT head unremarkable.  No history of liver disease.  Ammonia level normal.   Neurological exam nonfocal.   Mental status gradually improved. Continue Aricept  Elevated troponin No complaint of chest pain.  Mildly elevated troponin likely due to dehydration. Echocardiogram showed EF of 65 to 70%, moderate LVH, grade 2 diastolic dysfunction.  H/o hypertension Sinus bradycardia PTA meds- Cardizem 180 mg daily Because of bradycardia and elevated blood pressure, Cardizem was switched to oral amlodipine.  Type 2 diabetes mellitus A1c 6.8 on 08/07/2023 continue Not on meds PTA. Continue SSI/Accu-Cheks Recent Labs  Lab 08/13/23 1952 08/14/23 0025 08/14/23 0403 08/14/23 0407 08/14/23 0734  GLUCAP 149* 96 77 76 91   H/o pulm embolism Continue Xarelto  Chronic constipation Continue bowel regimen  Mild chronic anemia Continue vitamin B12, folic acid level Recent Labs    08/07/23 1645 08/07/23 2317 08/08/23 0050 08/09/23 0806 08/10/23 0711 08/11/23 0608  HGB 10.8*  --  10.4* 9.6* 9.6* 9.4*  MCV 96.8  --  97.0 97.4 96.8 98.0  VITAMINB12  --   --  923*  --   --   --   FOLATE  --   --  11.4  --   --   --   FERRITIN  --  43  --   --   --   --   TIBC  --  262  --   --   --   --   IRON  --  51  --   --   --   --   RETICCTPCT 1.3  --   --   --   --   --  Impaired mobility  PT eval obtained.  SNF recommended.  Goals of care   Code Status: Full Code   Wounds:  -    Discharge Exam:   Vitals:   08/13/23 1339 08/13/23 1957 08/14/23 0655 08/14/23 0949  BP: 122/75 (!) 135/57 113/62 (!) 176/81  Pulse: 66 72 60 62  Resp: 18 18 18    Temp: 98.2 F (36.8 C) 98.1 F (36.7 C) 97.7 F (36.5 C)   TempSrc: Oral Oral Oral   SpO2: 99% 99% 100%   Weight:       Height:        Body mass index is 20.91 kg/m.   General exam: Pleasant, elderly African-American female.  Not in physical distress Skin: No rashes, lesions or ulcers. HEENT: Atraumatic, normocephalic, no obvious bleeding Lungs: Clear to auscultation bilaterally CVS: Regular rate, regular rhythm, no murmur GI/Abd soft, nontender, nondistended, bowel sound present CNS: Alert, awake.  Oriented to place and person.  Weak.  Slow to respond Psychiatry: Stressed after talking to her daughter Extremities: No pedal edema, no calf tenderness  Follow ups:    Contact information for follow-up providers     Paliwal, Himanshu, MD Follow up.   Specialty: Family Medicine Contact information: [address] [place] [address] [phone]              Contact information for after-discharge care     Destination     [facility] .   Service: Skilled Nursing Contact information: [address] [place] [address] [phone]                     Discharge Instructions:   Discharge Instructions     Call MD for:  difficulty breathing, headache or visual disturbances   Complete by: As directed    Call MD for:  extreme fatigue   Complete by: As directed    Call MD for:  hives   Complete by: As directed    Call MD for:  persistant dizziness or light-headedness   Complete by: As directed    Call MD for:  persistant nausea and vomiting   Complete by: As directed    Call MD for:  severe uncontrolled pain   Complete by: As directed    Call MD for:  temperature >100.4   Complete by: As directed    Diet general   Complete by: As directed    Discharge instructions   Complete by: As directed    General discharge instructions: Follow with Primary MD Sharmon Revere, MD in 7 days  Please request your PCP  to go over your hospital tests, procedures, radiology results at the follow up. Please get your medicines reviewed and adjusted.   Your PCP may decide to repeat certain labs or tests as needed. Do not drive, operate heavy machinery, perform activities at heights, swimming or participation in water activities or provide baby sitting services if your were admitted for syncope or siezures until you have seen by Primary MD or a Neurologist and advised to do so again. [place] Controlled Substance Reporting System database was reviewed. Do not drive, operate heavy machinery, perform activities at heights, swim, participate in water activities or provide baby-sitting services while on medications for pain, sleep and mood until your outpatient physician has reevaluated you and advised to do so again.  You are strongly recommended to comply with the dose, frequency and duration of prescribed medications. Activity: As tolerated with Full fall precautions  use walker/cane & assistance as needed Avoid using any recreational substances like cigarette, tobacco, alcohol, or non-prescribed drug. If you experience worsening of your admission symptoms, develop shortness of breath, life threatening emergency, suicidal or homicidal thoughts you must seek medical attention immediately by calling 911 or calling your MD immediately  if symptoms less severe. You must read complete instructions/literature along with all the possible adverse reactions/side effects for all the medicines you take and that have been prescribed to you. Take any new medicine only after you have completely understood and accepted all the possible adverse reactions/side effects.  Wear Seat belts while driving. You were cared for by a hospitalist during your hospital stay. If you have any questions about your discharge medications or the care you received while you were in the hospital after you are discharged, you can call the unit and ask to speak with the hospitalist or the covering physician. Once you are discharged, your primary care physician will handle any further medical  issues. Please note that NO REFILLS for any discharge medications will be authorized once you are discharged, as it is imperative that you return to your primary care physician (or establish a relationship with a primary care physician if you do not have one).   Increase activity slowly   Complete by: As directed        Discharge Medications:   Allergies as of 08/14/2023       Reactions   Penicillins Hives, Itching, Rash, Other (See Comments)   Did it involve swelling of the face/tongue/throat, SOB, or low BP? No Did it involve sudden or severe rash/hives, skin peeling, or any reaction on the inside of your mouth or nose? Yes Did you need to seek medical attention at a hospital or doctor's office? Unknown When did it last happen?      Unknown (on MAR) If all above answers are "NO", may proceed with cephalosporin use.        Medication List     STOP taking these medications    Matzim LA 180 MG 24 hr tablet Generic drug: diltiazem   ondansetron 4 MG tablet Commonly known as: ZOFRAN       TAKE these medications    acetaminophen 325 MG tablet Commonly known as: TYLENOL Take 2 tablets (650 mg total) by mouth 3 (three) times daily.   amLODipine 5 MG tablet Commonly known as: NORVASC Take 1 tablet (5 mg total) by mouth daily.   B-12 + FOLIC ACID PO Take 1 tablet by mouth daily.   Cosopt 2-0.5 % ophthalmic solution Generic drug: dorzolamide-timolol Place 1 drop into both eyes 2 (two) times daily.   donepezil 5 MG tablet Commonly known as: ARICEPT Take 5 mg by mouth every evening.   feeding supplement Liqd Take 237 mLs by mouth 2 (two) times daily between meals.   latanoprost 0.005 % ophthalmic solution Commonly known as: XALATAN Place 1 drop into both eyes at bedtime.   MAGNESIUM PO Take 1 tablet by mouth daily.   polyethylene glycol 17 g packet Commonly known as: MIRALAX / GLYCOLAX Take 17 g by mouth daily as needed for mild constipation or moderate  constipation. What changed: when to take this   rivaroxaban 20 MG Tabs tablet Commonly known as: XARELTO Take 1 tablet (20 mg total) by mouth daily with supper.   sennosides-docusate sodium 8.6-50 MG tablet Commonly known as: SENOKOT-S Take 1 tablet by mouth 2 (two) times daily.   traMADol 50 MG tablet Commonly known  as: ULTRAM Take 1 tablet (50 mg total) by mouth every 12 (twelve) hours as needed for up to 3 days (for pain).   Vitamin D3 50 MCG (2000 UT) Tabs Take 2,000 Units by mouth daily.   zinc gluconate 50 MG tablet Take 50 mg by mouth daily.         The results of significant diagnostics from this hospitalization (including imaging, microbiology, ancillary and laboratory) are listed below for reference.    Procedures and Diagnostic Studies:   CT ABDOMEN PELVIS WO CONTRAST  Result Date: 08/07/2023 CLINICAL DATA:  Acute nonlocalized abdominal pain. Feels constipated and not eating or drinking well. Dementia with increased confusion. EXAM: CT ABDOMEN AND PELVIS WITHOUT CONTRAST TECHNIQUE: Multidetector CT imaging of the abdomen and pelvis was performed following the standard protocol without IV contrast. RADIATION DOSE REDUCTION: This exam was performed according to the departmental dose-optimization program which includes automated exposure control, adjustment of the mA and/or kV according to patient size and/or use of iterative reconstruction technique. COMPARISON:  05/01/2023 FINDINGS: Lower chest: Scarring and bronchitic changes in the lung bases. Hepatobiliary: No focal liver lesions. Cholelithiasis with small stones layering in the gallbladder. No inflammatory changes. No bile duct dilatation. Pancreas: Unremarkable. No pancreatic ductal dilatation or surrounding inflammatory changes. Spleen: Normal in size without focal abnormality. Adrenals/Urinary Tract: No adrenal gland nodules. Diffuse renal parenchymal atrophy bilaterally. No hydronephrosis or hydroureter. Bilateral  renal cysts. Representative cyst on the right lower pole measures 5.6 cm in diameter. No change since prior studies. No imaging follow-up is indicated. Bladder is normal. No renal, ureteral, or bladder stones. Stomach/Bowel: Stomach, small bowel, and colon are not abnormally distended. No wall thickening or inflammatory changes are appreciated. Postoperative right hemicolectomy with ileocolonic anastomosis. Ventral abdominal wall hernia containing small bowel and colon without evidence of proximal obstruction. Colonic diverticulosis without evidence of acute diverticulitis. Vascular/Lymphatic: Prominent calcification of the aorta and major branch vessels. No aneurysm. No significant lymphadenopathy. Reproductive: Status post hysterectomy. No adnexal masses. Other: No free air or free fluid in the abdomen. Fatty atrophy of the abdominal and hip musculature. Musculoskeletal: Degenerative changes in the spine and hips. No acute bony abnormalities. IMPRESSION: 1. Scarring and bronchitic changes in the lung bases. 2. Cholelithiasis without evidence of acute cholecystitis. 3. No evidence of bowel obstruction or inflammation. Ventral abdominal wall hernia contains small bowel and colon without evidence of proximal obstruction. 4. Prominent aortic atherosclerosis. 5. Colonic diverticulosis without evidence of acute diverticulitis. Electronically Signed   By: Burman Nieves M.D.   On: 08/07/2023 19:38   CT Head Wo Contrast  Result Date: 08/07/2023 CLINICAL DATA:  Minor head trauma. Dementia with increased confusion. EXAM: CT HEAD WITHOUT CONTRAST TECHNIQUE: Contiguous axial images were obtained from the base of the skull through the vertex without intravenous contrast. RADIATION DOSE REDUCTION: This exam was performed according to the departmental dose-optimization program which includes automated exposure control, adjustment of the mA and/or kV according to patient size and/or use of iterative reconstruction  technique. COMPARISON:  MRI brain 07/17/2018.  CT head 03/13/2020 FINDINGS: Brain: Diffuse cerebral atrophy. Ventricular dilatation consistent with central atrophy. Low-attenuation changes in the deep white matter consistent with small vessel ischemia. No abnormal extra-axial fluid collections. No mass effect or midline shift. Gray-white matter junctions are distinct. Basal cisterns are not effaced. No acute intracranial hemorrhage. Vascular: No hyperdense vessel or unexpected calcification. Skull: Normal. Negative for fracture or focal lesion. Sinuses/Orbits: No acute finding. Other: None. IMPRESSION: No acute intracranial abnormalities. Chronic atrophy  and small vessel ischemic changes. Electronically Signed   By: Burman Nieves M.D.   On: 08/07/2023 19:30   DG Chest Port 1 View  Result Date: 08/07/2023 CLINICAL DATA:  Weakness. EXAM: PORTABLE CHEST 1 VIEW COMPARISON:  Apr 30, 2023. FINDINGS: Stable cardiomediastinal silhouette. Both lungs are clear. The visualized skeletal structures are unremarkable. IMPRESSION: No active disease. Electronically Signed   By: Lupita Raider M.D.   On: 08/07/2023 16:50     Labs:   Basic Metabolic Panel: Recent Labs  Lab 08/07/23 1645 08/07/23 2317 08/08/23 0050 08/09/23 0806 08/10/23 0711 08/11/23 0608  NA 141  --  140 142 141 139  K 4.3  --  3.7 4.4 4.2 4.2  CL 106  --  110 114* 110 108  CO2 26  --  25 23 24 27   GLUCOSE 105*  --  163* 85 101* 91  BUN 35*  --  26* 22 17 18   CREATININE 1.55*  --  1.32* 0.98 0.98 0.91  CALCIUM 10.5*  --  9.9 9.4 10.0 9.8  MG  --  2.1 2.3  --   --   --   PHOS  --  2.9 2.6  --   --   --    GFR Estimated Creatinine Clearance: 36.2 mL/min (by C-G formula based on SCr of 0.91 mg/dL). Liver Function Tests: Recent Labs  Lab 08/07/23 1645 08/08/23 0050  AST 19 19  ALT 11 10  ALKPHOS 49 44  BILITOT 0.2* 0.3  PROT 8.0 7.4  ALBUMIN 3.3* 3.0*   Recent Labs  Lab 08/07/23 1645  LIPASE 36   Recent Labs  Lab  08/07/23 1645  AMMONIA 18   Coagulation profile No results for input(s): "INR", "PROTIME" in the last 168 hours.  CBC: Recent Labs  Lab 08/07/23 1645 08/08/23 0050 08/09/23 0806 08/10/23 0711 08/11/23 0608  WBC 4.9 4.1 3.4* 3.9* 4.0  NEUTROABS 3.5  --  1.7 2.2 2.2  HGB 10.8* 10.4* 9.6* 9.6* 9.4*  HCT 33.0* 32.5* 30.5* 30.5* 29.6*  MCV 96.8 97.0 97.4 96.8 98.0  PLT 190 183 149* 165 162   Cardiac Enzymes: Recent Labs  Lab 08/07/23 2317  CKTOTAL 43   BNP: Invalid input(s): "POCBNP" CBG: Recent Labs  Lab 08/13/23 1952 08/14/23 0025 08/14/23 0403 08/14/23 0407 08/14/23 0734  GLUCAP 149* 96 77 76 91   D-Dimer No results for input(s): "DDIMER" in the last 72 hours. Hgb A1c No results for input(s): "HGBA1C" in the last 72 hours. Lipid Profile No results for input(s): "CHOL", "HDL", "LDLCALC", "TRIG", "CHOLHDL", "LDLDIRECT" in the last 72 hours. Thyroid function studies No results for input(s): "TSH", "T4TOTAL", "T3FREE", "THYROIDAB" in the last 72 hours.  Invalid input(s): "FREET3" Anemia work up No results for input(s): "VITAMINB12", "FOLATE", "FERRITIN", "TIBC", "IRON", "RETICCTPCT" in the last 72 hours. Microbiology Recent Results (from the past 240 hour(s))  Resp panel by RT-PCR (RSV, Flu A&B, Covid) Anterior Nasal Swab     Status: None   Collection Time: 08/07/23 11:23 PM   Specimen: Anterior Nasal Swab  Result Value Ref Range Status   SARS Coronavirus 2 by RT PCR NEGATIVE NEGATIVE Final    Comment: (NOTE) SARS-CoV-2 target nucleic acids are NOT DETECTED.  The SARS-CoV-2 RNA is generally detectable in upper respiratory specimens during the acute phase of infection. The lowest concentration of SARS-CoV-2 viral copies this assay can detect is 138 copies/mL. A negative result does not preclude SARS-Cov-2 infection and should not be used as the  sole basis for treatment or other patient management decisions. A negative result may occur with  improper  specimen collection/handling, submission of specimen other than nasopharyngeal swab, presence of viral mutation(s) within the areas targeted by this assay, and inadequate number of viral copies(<138 copies/mL). A negative result must be combined with clinical observations, patient history, and epidemiological information. The expected result is Negative.  Fact Sheet for Patients:  BloggerCourse.com  Fact Sheet for Healthcare Providers:  SeriousBroker.it  This test is no t yet approved or cleared by the [place] FDA and  has been authorized for detection and/or diagnosis of SARS-CoV-2 by FDA under an Emergency Use Authorization (EUA). This EUA will remain  in effect (meaning this test can be used) for the duration of the COVID-19 declaration under Section 564(b)(1) of the Act, 21 U.S.C.section 360bbb-3(b)(1), unless the authorization is terminated  or revoked sooner.       Influenza A by PCR NEGATIVE NEGATIVE Final   Influenza B by PCR NEGATIVE NEGATIVE Final    Comment: (NOTE) The Xpert Xpress SARS-CoV-2/FLU/RSV plus assay is intended as an aid in the diagnosis of influenza from Nasopharyngeal swab specimens and should not be used as a sole basis for treatment. Nasal washings and aspirates are unacceptable for Xpert Xpress SARS-CoV-2/FLU/RSV testing.  Fact Sheet for Patients: BloggerCourse.com  Fact Sheet for Healthcare Providers: SeriousBroker.it  This test is not yet approved or cleared by the [place] FDA and has been authorized for detection and/or diagnosis of SARS-CoV-2 by FDA under an Emergency Use Authorization (EUA). This EUA will remain in effect (meaning this test can be used) for the duration of the COVID-19 declaration under Section 564(b)(1) of the Act, 21 U.S.C. section 360bbb-3(b)(1), unless the authorization is terminated or revoked.     Resp  Syncytial Virus by PCR NEGATIVE NEGATIVE Final    Comment: (NOTE) Fact Sheet for Patients: BloggerCourse.com  Fact Sheet for Healthcare Providers: SeriousBroker.it  This test is not yet approved or cleared by the [place] FDA and has been authorized for detection and/or diagnosis of SARS-CoV-2 by FDA under an Emergency Use Authorization (EUA). This EUA will remain in effect (meaning this test can be used) for the duration of the COVID-19 declaration under Section 564(b)(1) of the Act, 21 U.S.C. section 360bbb-3(b)(1), unless the authorization is terminated or revoked.  Performed at [facility], [address], [place], [place] [address]     Time coordinating discharge: 45 minutes  Signed: Melina Schools Dyer Klug  [facility] 08/14/2023, 11:04 AM

## 2023-08-13 NOTE — Progress Notes (Signed)
Physical Therapy Treatment Patient Details Name: Kathryn Bradford MRN: [id number] DOB: October 27, 1930 Today's Date: 08/13/2023   History of Present Illness Patient is a 89 yo female presents to therapy following hospital admission on 08/07/2023 secondary to AMS, progressive weakness, abdominal pain and poor PO intake. Pt found to have mild AKI.  Pt PMH includes but is not limited to: SM II, SBO, aortic insufficiency, dCHF, peripheral arterial dz, HTN, HLD, PE, DVT, glaucoma, constipation and dementia.    PT Comments  Pt informed therapist she was discharging on today. She requested assistance to bathroom. Mostly CGA except to rise from low toilet. Assisted pt back to bed. Per chart, pt is set to d/c for ST rehab.    If plan is discharge home, recommend the following: A little help with walking and/or transfers;A little help with bathing/dressing/bathroom;Assistance with cooking/housework;Help with stairs or ramp for entrance;Assist for transportation   Can travel by private vehicle        Equipment Recommendations  None recommended by PT    Recommendations for Other Services       Precautions / Restrictions Precautions Precautions: Fall Restrictions Weight Bearing Restrictions: No     Mobility  Bed Mobility   Bed Mobility: Supine to Sit, Sit to Supine     Supine to sit: Supervision, HOB elevated Sit to supine: Supervision, HOB elevated   General bed mobility comments: Supv for safety. Increased time.    Transfers Overall transfer level: Needs assistance Equipment used: Rolling walker (2 wheels) Transfers: Sit to/from Stand Sit to Stand: Min assist, Contact guard assist           General transfer comment: CGA to rise from elevated bed. Min A to rise from toilet height. Cues for safety, hand placement. Increased time.    Ambulation/Gait Ambulation/Gait assistance: Contact guard assist Gait Distance (Feet): 15 Feet (x2) Assistive device: Rolling walker (2 wheels) Gait  Pattern/deviations: Step-through pattern, Trunk flexed, Decreased stride length       General Gait Details: CGA for safety. No LOB with RW use. Slow gait speed.   Stairs             Wheelchair Mobility     Tilt Bed    Modified Rankin (Stroke Patients Only)       Balance Overall balance assessment: Needs assistance         Standing balance support: Bilateral upper extremity supported, During functional activity, Reliant on assistive device for balance Standing balance-Leahy Scale: Fair                              Cognition Arousal: Alert Behavior During Therapy: WFL for tasks assessed/performed Overall Cognitive Status: Within Functional Limits for tasks assessed                                          Exercises      General Comments        Pertinent Vitals/Pain Pain Assessment Pain Assessment: Faces Faces Pain Scale: Hurts a little bit Pain Location: joints (knees, hands) Pain Descriptors / Indicators: Discomfort Pain Intervention(s): Monitored during session, Repositioned    Home Living Family/patient expects to be discharged to:: Skilled nursing facility Living Arrangements: Alone                      Prior Function  PT Goals (current goals can now be found in the care plan section) Progress towards PT goals: Progressing toward goals    Frequency    Min 1X/week      PT Plan      Co-evaluation              AM-PAC PT "6 Clicks" Mobility   Outcome Measure  Help needed turning from your back to your side while in a flat bed without using bedrails?: None Help needed moving from lying on your back to sitting on the side of a flat bed without using bedrails?: None Help needed moving to and from a bed to a chair (including a wheelchair)?: A Little Help needed standing up from a chair using your arms (e.g., wheelchair or bedside chair)?: A Little Help needed to walk in hospital  room?: A Little Help needed climbing 3-5 steps with a railing? : A Little 6 Click Score: 20    End of Session Equipment Utilized During Treatment: Gait belt Activity Tolerance: Patient tolerated treatment well Patient left: in bed;with call bell/phone within reach;with bed alarm set   PT Visit Diagnosis: Unsteadiness on feet (R26.81);Muscle weakness (generalized) (M62.81);Pain;Difficulty in walking, not elsewhere classified (R26.2)     Time: 1610-9604 PT Time Calculation (min) (ACUTE ONLY): 15 min  Charges:    $Gait Training: 8-22 mins PT General Charges $$ ACUTE PT VISIT: 1 Visit                         Faye Ramsay, PT Acute Rehabilitation  Office: [phone]

## 2023-08-13 NOTE — Progress Notes (Signed)
Paper rx placed in discharge packet along with discharge paperwork.

## 2023-08-14 LAB — GLUCOSE, CAPILLARY
Glucose-Capillary: 209 mg/dL — ABNORMAL HIGH (ref 70–99)
Glucose-Capillary: 76 mg/dL (ref 70–99)
Glucose-Capillary: 77 mg/dL (ref 70–99)
Glucose-Capillary: 91 mg/dL (ref 70–99)
Glucose-Capillary: 96 mg/dL (ref 70–99)

## 2023-08-14 NOTE — Progress Notes (Addendum)
Patient is being discharged to [facility]. Report called to [place] RN.

## 2023-08-14 NOTE — TOC Transition Note (Addendum)
Transition of Care [facility]) - CM/SW Discharge Note   Patient Details  Name: Kathryn Bradford MRN: [id number] Date of Birth: [birth year]  [facility] CM/SW Contact:  Beckie Busing, RN Phone Number:[phone]  08/14/2023, 10:27 AM   Clinical Narrative:    Patient has discharge order. Plan to d/c to [facility] today. CM followed up with [facility] liaison for [facility]groom. Per [facility] patient can be admitted today after daughter comes in to sign the paperwork at about 1:30pm today. Discharge summary has been faxed to [facility]. D/c packet has been placed in chart at nurses station.   Please call report to [facility] Room # [address] [phone]  [phone] CM received call from [facility] at [facility]. Daughter is at facility to sign paperwork and transportation can be called. Transportation has been arranged per [facility]. Daughter is in office on speaker phone and made aware. Nurse has been updated. No further TOC needs noted at this time. TOC will sign off.       Barriers to Discharge: Family Issues   Patient Goals and CMS Choice CMS Medicare.gov Compare Post Acute Care list provided to:: Patient Represenative (must comment) (daughter Clide Cliff) Choice offered to / list presented to : Adult Children  Discharge Placement                         Discharge Plan and Services Additional resources added to the After Visit Summary for   In-house Referral: Clinical Social Work                                   Social Determinants of Health (SDOH) Interventions SDOH Screenings   Food Insecurity: No Food Insecurity (08/08/2023)  Housing: Low Risk  (08/08/2023)  Transportation Needs: No Transportation Needs (08/08/2023)  Utilities: Not At Risk (08/08/2023)  Alcohol Screen: Low Risk  (08/08/2023)  Depression (PHQ2-9): Low Risk  (08/08/2023)  Financial Resource Strain: Low Risk  (08/08/2023)  Physical Activity: Inactive (08/08/2023)  Social Connections: Moderately  Integrated (08/08/2023)  Stress: No Stress Concern Present (08/08/2023)  Tobacco Use: High Risk (05/01/2023)   Received from [facility]  Health Literacy: Adequate Health Literacy (08/08/2023)     Readmission Risk Interventions     No data to display

## 2023-08-14 NOTE — Progress Notes (Signed)
Patient was prepared for discharge yesterday.  She could not leave because the daughter did not have time to sign paper at SNF. No acute issues overnight Briefly seen and examined this morning.  No new issues. Patient's daughter to complete paperwork this morning.  Tentative plan to transport this afternoon Discharge order remains in place. [facility] will not bill for today.

## 2023-08-14 NOTE — Progress Notes (Signed)
CSW received voicemail from [employer] that CSW's report did not meet criteria for their involvement and was screened out however they will be sending a letter to [employer] attorney and law-enforcement.

## 2023-08-15 LAB — VITAMIN B1: Vitamin B1 (Thiamine): 70.2 nmol/L (ref 66.5–200.0)
